# Patient Record
Sex: Female | Born: 1950 | Race: White | Hispanic: No | Smoking: Former smoker
Health system: Southern US, Community
[De-identification: ages and names within clinical notes are randomized; demographics above are authoritative.]

## PROBLEM LIST (undated history)

## (undated) DIAGNOSIS — R7301 Impaired fasting glucose: Secondary | ICD-10-CM

## (undated) DIAGNOSIS — I10 Essential (primary) hypertension: Secondary | ICD-10-CM

## (undated) DIAGNOSIS — K219 Gastro-esophageal reflux disease without esophagitis: Secondary | ICD-10-CM

## (undated) DIAGNOSIS — C801 Malignant (primary) neoplasm, unspecified: Secondary | ICD-10-CM

## (undated) DIAGNOSIS — E785 Hyperlipidemia, unspecified: Secondary | ICD-10-CM

## (undated) HISTORY — PX: PILONIDAL CYST EXCISION: SHX744

## (undated) HISTORY — PX: OTHER SURGICAL HISTORY: SHX169

## (undated) HISTORY — DX: Impaired fasting glucose: R73.01

## (undated) HISTORY — PX: VAGINAL HYSTERECTOMY: SUR661

## (undated) HISTORY — DX: Hyperlipidemia, unspecified: E78.5

## (undated) HISTORY — DX: Essential (primary) hypertension: I10

## (undated) HISTORY — PX: CYSTOCELE REPAIR: SHX163

## (undated) HISTORY — DX: Malignant (primary) neoplasm, unspecified: C80.1

## (undated) HISTORY — DX: Gastro-esophageal reflux disease without esophagitis: K21.9

---

## 1997-12-21 ENCOUNTER — Ambulatory Visit (HOSPITAL_COMMUNITY): Admission: RE | Admit: 1997-12-21 | Discharge: 1997-12-21 | Payer: Self-pay | Admitting: *Deleted

## 1997-12-23 ENCOUNTER — Ambulatory Visit (HOSPITAL_COMMUNITY): Admission: RE | Admit: 1997-12-23 | Discharge: 1997-12-23 | Payer: Self-pay | Admitting: *Deleted

## 1999-11-23 ENCOUNTER — Encounter: Payer: Self-pay | Admitting: Family Medicine

## 1999-11-23 ENCOUNTER — Ambulatory Visit (HOSPITAL_COMMUNITY): Admission: RE | Admit: 1999-11-23 | Discharge: 1999-11-23 | Payer: Self-pay | Admitting: Family Medicine

## 1999-12-01 ENCOUNTER — Encounter: Admission: RE | Admit: 1999-12-01 | Discharge: 1999-12-01 | Payer: Self-pay | Admitting: Family Medicine

## 1999-12-01 ENCOUNTER — Encounter: Payer: Self-pay | Admitting: Family Medicine

## 1999-12-28 ENCOUNTER — Other Ambulatory Visit: Admission: RE | Admit: 1999-12-28 | Discharge: 1999-12-28 | Payer: Self-pay | Admitting: Family Medicine

## 2000-12-06 ENCOUNTER — Encounter: Admission: RE | Admit: 2000-12-06 | Discharge: 2000-12-06 | Payer: Self-pay | Admitting: Family Medicine

## 2000-12-06 ENCOUNTER — Encounter: Payer: Self-pay | Admitting: Family Medicine

## 2001-05-20 ENCOUNTER — Encounter: Admission: RE | Admit: 2001-05-20 | Discharge: 2001-05-20 | Payer: Self-pay | Admitting: Family Medicine

## 2001-05-20 ENCOUNTER — Encounter: Payer: Self-pay | Admitting: Family Medicine

## 2001-12-12 ENCOUNTER — Emergency Department (HOSPITAL_COMMUNITY): Admission: EM | Admit: 2001-12-12 | Discharge: 2001-12-12 | Payer: Self-pay | Admitting: *Deleted

## 2001-12-12 ENCOUNTER — Encounter: Payer: Self-pay | Admitting: Emergency Medicine

## 2002-01-21 ENCOUNTER — Encounter: Payer: Self-pay | Admitting: Family Medicine

## 2002-01-21 ENCOUNTER — Encounter: Admission: RE | Admit: 2002-01-21 | Discharge: 2002-01-21 | Payer: Self-pay | Admitting: Family Medicine

## 2003-01-28 ENCOUNTER — Encounter: Payer: Self-pay | Admitting: Family Medicine

## 2003-01-28 ENCOUNTER — Encounter: Admission: RE | Admit: 2003-01-28 | Discharge: 2003-01-28 | Payer: Self-pay | Admitting: Family Medicine

## 2003-10-21 ENCOUNTER — Other Ambulatory Visit: Admission: RE | Admit: 2003-10-21 | Discharge: 2003-10-21 | Payer: Self-pay | Admitting: Family Medicine

## 2004-02-05 ENCOUNTER — Encounter: Admission: RE | Admit: 2004-02-05 | Discharge: 2004-02-05 | Payer: Self-pay | Admitting: Family Medicine

## 2004-09-13 ENCOUNTER — Ambulatory Visit: Payer: Self-pay | Admitting: Family Medicine

## 2004-09-19 ENCOUNTER — Ambulatory Visit: Payer: Self-pay | Admitting: Family Medicine

## 2005-07-03 ENCOUNTER — Ambulatory Visit: Payer: Self-pay | Admitting: Family Medicine

## 2005-12-04 ENCOUNTER — Encounter: Admission: RE | Admit: 2005-12-04 | Discharge: 2005-12-04 | Payer: Self-pay | Admitting: Family Medicine

## 2005-12-21 ENCOUNTER — Ambulatory Visit: Payer: Self-pay | Admitting: Family Medicine

## 2005-12-26 ENCOUNTER — Encounter: Admission: RE | Admit: 2005-12-26 | Discharge: 2005-12-26 | Payer: Self-pay | Admitting: Family Medicine

## 2006-12-19 ENCOUNTER — Encounter: Payer: Self-pay | Admitting: Family Medicine

## 2006-12-19 ENCOUNTER — Encounter: Payer: Self-pay | Admitting: Internal Medicine

## 2006-12-19 ENCOUNTER — Ambulatory Visit: Payer: Self-pay | Admitting: Family Medicine

## 2006-12-19 LAB — CONVERTED CEMR LAB
ALT: 49 units/L — ABNORMAL HIGH (ref 0–40)
AST: 36 units/L (ref 0–37)
Alkaline Phosphatase: 61 units/L (ref 39–117)
CO2: 31 meq/L (ref 19–32)
Calcium: 9.4 mg/dL (ref 8.4–10.5)
Chloride: 106 meq/L (ref 96–112)
GFR calc Af Amer: 95 mL/min
GFR calc non Af Amer: 79 mL/min
Potassium: 4.1 meq/L (ref 3.5–5.1)
Total Bilirubin: 0.9 mg/dL (ref 0.3–1.2)
Total Protein: 6.9 g/dL (ref 6.0–8.3)

## 2007-01-04 DIAGNOSIS — E785 Hyperlipidemia, unspecified: Secondary | ICD-10-CM

## 2007-01-04 DIAGNOSIS — K589 Irritable bowel syndrome without diarrhea: Secondary | ICD-10-CM

## 2007-01-04 DIAGNOSIS — L719 Rosacea, unspecified: Secondary | ICD-10-CM

## 2007-01-04 DIAGNOSIS — I10 Essential (primary) hypertension: Secondary | ICD-10-CM

## 2007-01-15 ENCOUNTER — Encounter (INDEPENDENT_AMBULATORY_CARE_PROVIDER_SITE_OTHER): Payer: Self-pay | Admitting: *Deleted

## 2007-03-04 ENCOUNTER — Ambulatory Visit: Payer: Self-pay | Admitting: Family Medicine

## 2007-03-04 DIAGNOSIS — K219 Gastro-esophageal reflux disease without esophagitis: Secondary | ICD-10-CM | POA: Insufficient documentation

## 2007-03-04 DIAGNOSIS — M810 Age-related osteoporosis without current pathological fracture: Secondary | ICD-10-CM | POA: Insufficient documentation

## 2007-03-04 DIAGNOSIS — IMO0002 Reserved for concepts with insufficient information to code with codable children: Secondary | ICD-10-CM

## 2007-03-19 ENCOUNTER — Encounter: Payer: Self-pay | Admitting: Family Medicine

## 2007-03-20 ENCOUNTER — Telehealth: Payer: Self-pay | Admitting: Family Medicine

## 2007-08-15 ENCOUNTER — Telehealth: Payer: Self-pay | Admitting: Family Medicine

## 2008-01-08 ENCOUNTER — Telehealth: Payer: Self-pay | Admitting: Family Medicine

## 2008-01-09 ENCOUNTER — Telehealth: Payer: Self-pay | Admitting: Family Medicine

## 2008-04-08 ENCOUNTER — Ambulatory Visit: Payer: Self-pay | Admitting: Family Medicine

## 2008-04-08 DIAGNOSIS — M130 Polyarthritis, unspecified: Secondary | ICD-10-CM

## 2008-04-08 DIAGNOSIS — M1990 Unspecified osteoarthritis, unspecified site: Secondary | ICD-10-CM | POA: Insufficient documentation

## 2008-04-09 ENCOUNTER — Ambulatory Visit: Payer: Self-pay | Admitting: Family Medicine

## 2008-04-09 ENCOUNTER — Encounter: Admission: RE | Admit: 2008-04-09 | Discharge: 2008-04-09 | Payer: Self-pay | Admitting: Family Medicine

## 2008-04-09 LAB — CONVERTED CEMR LAB: Anti Nuclear Antibody(ANA): NEGATIVE

## 2008-04-10 ENCOUNTER — Encounter: Payer: Self-pay | Admitting: Family Medicine

## 2008-04-13 ENCOUNTER — Encounter (INDEPENDENT_AMBULATORY_CARE_PROVIDER_SITE_OTHER): Payer: Self-pay | Admitting: *Deleted

## 2008-04-15 LAB — CONVERTED CEMR LAB
AST: 28 units/L (ref 0–37)
Albumin: 4.4 g/dL (ref 3.5–5.2)
CO2: 30 meq/L (ref 19–32)
Calcium: 9.5 mg/dL (ref 8.4–10.5)
Chloride: 105 meq/L (ref 96–112)
GFR calc Af Amer: 83 mL/min
GFR calc non Af Amer: 69 mL/min
LDL Cholesterol: 95 mg/dL (ref 0–99)
Potassium: 4.3 meq/L (ref 3.5–5.1)
TSH: 2.06 microintl units/mL (ref 0.35–5.50)

## 2008-04-16 ENCOUNTER — Encounter (INDEPENDENT_AMBULATORY_CARE_PROVIDER_SITE_OTHER): Payer: Self-pay | Admitting: *Deleted

## 2008-04-20 ENCOUNTER — Telehealth: Payer: Self-pay | Admitting: Family Medicine

## 2008-05-11 ENCOUNTER — Ambulatory Visit: Payer: Self-pay | Admitting: Family Medicine

## 2008-05-15 ENCOUNTER — Telehealth: Payer: Self-pay | Admitting: Family Medicine

## 2008-05-22 ENCOUNTER — Ambulatory Visit: Payer: Self-pay | Admitting: Family Medicine

## 2008-05-27 ENCOUNTER — Encounter: Payer: Self-pay | Admitting: Family Medicine

## 2008-06-01 ENCOUNTER — Telehealth: Payer: Self-pay | Admitting: Family Medicine

## 2008-08-24 ENCOUNTER — Ambulatory Visit: Payer: Self-pay | Admitting: Family Medicine

## 2008-08-25 LAB — CONVERTED CEMR LAB
ALT: 29 units/L (ref 0–35)
Cholesterol: 165 mg/dL (ref 0–200)
HDL: 47.3 mg/dL (ref >39.0)
VLDL: 21 mg/dL (ref 0–40)

## 2008-09-15 ENCOUNTER — Telehealth: Payer: Self-pay | Admitting: Family Medicine

## 2008-09-28 ENCOUNTER — Encounter (INDEPENDENT_AMBULATORY_CARE_PROVIDER_SITE_OTHER): Payer: Self-pay | Admitting: *Deleted

## 2008-10-02 ENCOUNTER — Telehealth: Payer: Self-pay | Admitting: Family Medicine

## 2008-11-10 ENCOUNTER — Ambulatory Visit: Payer: Self-pay | Admitting: Family Medicine

## 2008-11-26 ENCOUNTER — Telehealth: Payer: Self-pay | Admitting: Family Medicine

## 2008-11-30 ENCOUNTER — Ambulatory Visit: Payer: Self-pay | Admitting: Family Medicine

## 2008-12-02 ENCOUNTER — Encounter: Payer: Self-pay | Admitting: Family Medicine

## 2008-12-05 ENCOUNTER — Encounter: Payer: Self-pay | Admitting: Family Medicine

## 2008-12-05 ENCOUNTER — Ambulatory Visit: Payer: Self-pay | Admitting: Family Medicine

## 2008-12-08 ENCOUNTER — Telehealth: Payer: Self-pay | Admitting: Family Medicine

## 2008-12-16 ENCOUNTER — Telehealth: Payer: Self-pay | Admitting: Family Medicine

## 2008-12-29 ENCOUNTER — Telehealth: Payer: Self-pay | Admitting: Family Medicine

## 2009-01-11 ENCOUNTER — Telehealth: Payer: Self-pay | Admitting: Family Medicine

## 2009-02-11 ENCOUNTER — Telehealth: Payer: Self-pay | Admitting: Family Medicine

## 2009-02-11 DIAGNOSIS — N816 Rectocele: Secondary | ICD-10-CM

## 2009-04-12 ENCOUNTER — Encounter: Admission: RE | Admit: 2009-04-12 | Discharge: 2009-04-12 | Payer: Self-pay | Admitting: Family Medicine

## 2009-04-13 ENCOUNTER — Encounter (INDEPENDENT_AMBULATORY_CARE_PROVIDER_SITE_OTHER): Payer: Self-pay | Admitting: *Deleted

## 2009-04-14 ENCOUNTER — Telehealth: Payer: Self-pay | Admitting: Family Medicine

## 2009-10-11 ENCOUNTER — Telehealth: Payer: Self-pay | Admitting: Family Medicine

## 2009-10-13 ENCOUNTER — Ambulatory Visit: Payer: Self-pay | Admitting: Family Medicine

## 2009-10-14 ENCOUNTER — Encounter: Payer: Self-pay | Admitting: Family Medicine

## 2009-10-15 LAB — CONVERTED CEMR LAB
ALT: 29 units/L (ref 0–35)
AST: 20 units/L (ref 0–37)
Albumin: 4.8 g/dL (ref 3.5–5.2)
Alkaline Phosphatase: 54 units/L (ref 39–117)
CO2: 25 meq/L (ref 19–32)
Cholesterol: 187 mg/dL (ref 0–200)
Creatinine, Ser: 1 mg/dL (ref 0.40–1.20)
Sodium: 143 meq/L (ref 135–145)
TSH: 2.782 microintl units/mL (ref 0.350–4.500)
Total Bilirubin: 0.7 mg/dL (ref 0.3–1.2)
Total CHOL/HDL Ratio: 3.6

## 2009-11-02 ENCOUNTER — Ambulatory Visit: Payer: Self-pay | Admitting: Family Medicine

## 2009-11-02 DIAGNOSIS — J309 Allergic rhinitis, unspecified: Secondary | ICD-10-CM | POA: Insufficient documentation

## 2009-11-02 LAB — CONVERTED CEMR LAB: Rapid Strep: NEGATIVE

## 2009-11-15 ENCOUNTER — Ambulatory Visit: Payer: Self-pay | Admitting: Family Medicine

## 2009-11-15 DIAGNOSIS — E559 Vitamin D deficiency, unspecified: Secondary | ICD-10-CM

## 2009-11-15 DIAGNOSIS — E348 Other specified endocrine disorders: Secondary | ICD-10-CM | POA: Insufficient documentation

## 2009-11-15 LAB — CONVERTED CEMR LAB: Blood Glucose, Fasting: 109 mg/dL

## 2009-11-29 ENCOUNTER — Telehealth: Payer: Self-pay | Admitting: Family Medicine

## 2010-01-14 ENCOUNTER — Encounter: Payer: Self-pay | Admitting: Family Medicine

## 2010-01-14 LAB — CONVERTED CEMR LAB
Cholesterol: 150 mg/dL (ref 0–200)
HDL: 49 mg/dL (ref >39)
LDL Cholesterol: 79 mg/dL (ref 0–99)

## 2010-01-17 ENCOUNTER — Encounter (INDEPENDENT_AMBULATORY_CARE_PROVIDER_SITE_OTHER): Payer: Self-pay | Admitting: *Deleted

## 2010-01-21 ENCOUNTER — Telehealth: Payer: Self-pay | Admitting: Family Medicine

## 2010-03-17 ENCOUNTER — Ambulatory Visit: Payer: Self-pay | Admitting: Family Medicine

## 2010-03-17 DIAGNOSIS — B977 Papillomavirus as the cause of diseases classified elsewhere: Secondary | ICD-10-CM

## 2010-03-17 DIAGNOSIS — F339 Major depressive disorder, recurrent, unspecified: Secondary | ICD-10-CM

## 2010-03-17 LAB — CONVERTED CEMR LAB: Blood Glucose, AC Bkfst: 106 mg/dL

## 2010-03-18 ENCOUNTER — Encounter: Payer: Self-pay | Admitting: Family Medicine

## 2010-03-18 LAB — CONVERTED CEMR LAB
Clue Cells Wet Prep HPF POC: NONE SEEN
Trich, Wet Prep: NONE SEEN
Yeast Wet Prep HPF POC: NONE SEEN

## 2010-04-13 ENCOUNTER — Encounter: Admission: RE | Admit: 2010-04-13 | Discharge: 2010-04-13 | Payer: Self-pay | Admitting: Family Medicine

## 2010-08-20 ENCOUNTER — Encounter: Payer: Self-pay | Admitting: Family Medicine

## 2010-08-21 ENCOUNTER — Encounter: Payer: Self-pay | Admitting: Family Medicine

## 2010-09-01 NOTE — Assessment & Plan Note (Signed)
Summary: HTN, lipids, mood, vaginitis.   Vital Signs:  Patient profile:   61 year old female Menstrual status:  hysterectomy Height:      67.4 inches Weight:      161 pounds Pulse rate:   93 / minute BP sitting:   129 / 81  (left arm) Cuff size:   regular  Vitals Entered By: Avon Gully CMA, Duncan Dull) (March 17, 2010 9:40 AM) CC: f/u BP and check glucose, pt is fasting,also wants refill of ambien   Primary Care Provider:  Nani Gasser MD  CC:  f/u BP and check glucose, pt is fasting, and also wants refill of ambien.  History of Present Illness: f/u BP and check glucose, pt is fasting,also wants refill of ambien. Says it is really helping her sleep.  Needs refills. Has lost 14 lbs. Has been trying to loose weight to bring her sugars down and her BP down.   Her husband passed away last week form esophageal cancer. Hospice has been in involved for the lat 4 months. She is overall doing well. She has very supportive family that lives nearby.  She is taking her fluoxetine. Ambien is helping with her sleep right now.   Current Medications (verified): 1)  Simvastatin 40 Mg Tabs (Simvastatin) .... Take 1 Tablet By Mouth Once A Day 2)  Zyrtec Allergy 10 Mg  Tabs (Cetirizine Hcl) .... Take 1 Tablet By Mouth Once A Day 3)  Cloderm 0.1 %  Crea (Clocortolone Pivalate) .... As Needed Hemorrrhoids 4)  Hydrocortisone Valerate 0.2 %  Crea (Hydrocortisone Valerate) .... Apply To Affected Area Two Times A Day 5)  Vitamin C 500 Mg  Tabs (Ascorbic Acid) .... Take 1 Tablet By Mouth Once A Day 6)  Meloxicam 15 Mg Tabs (Meloxicam) .Marland Kitchen.. 1 Tab By Mouth Daily 7)  Calcium 600/vitamin D 600-400 Mg-Unit Tabs (Calcium Carbonate-Vitamin D) .Marland Kitchen.. 1 Tab By Mouth Two Times A Day 8)  Alendronate Sodium 70 Mg Tabs (Alendronate Sodium) .Marland Kitchen.. 1 Tab By Mouth Each Week 9)  Fluoxetine Hcl 20 Mg Caps (Fluoxetine Hcl) .... Takje One Tablet By Mouth Oncea D Ay 10)  Alodox Convenience 20 Mg Kit (Doxycycline-Eyelid  Cleansers) 11)  Zantac 150 Mg Tabs (Ranitidine Hcl) .... Take One Tablet By Mouth Daily For Heartburn 12)  Fluticasone Propionate 50 Mcg/act Susp (Fluticasone Propionate) .... 2 Sprays in Each Nostril Daily 13)  Vitamin D 2000 Unit Caps (Cholecalciferol) .... Take 1 Tablet By Mouth Once A Day 14)  Zolpidem Tartrate 5 Mg Tabs (Zolpidem Tartrate) .Marland Kitchen.. 1 Tab By Mouth At Bedtime As Needed Sleep  Allergies (verified): 1)  ! Benadryl 2)  ! Codeine 3)  Fish Oil (Omega-3 Fatty Acids)  Comments:  Nurse/Medical Assistant: The patient's medications and allergies were reviewed with the patient and were updated in the Medication and Allergy Lists. Avon Gully CMA, Duncan Dull) (March 17, 2010 9:40 AM)  Physical Exam  General:  Well-developed,well-nourished,in no acute distress; alert,appropriate and cooperative throughout examination Lungs:  Normal respiratory effort, chest expands symmetrically. Lungs are clear to auscultation, no crackles or wheezes. Heart:  Normal rate and regular rhythm. S1 and S2 normal without gallop, murmur, click, rub or other extra sounds. No carotid bruits.  Skin:  no rashes.   Psych:  Cognition and judgment appear intact. Alert and cooperative with normal attention span and concentration. No apparent delusions, illusions, hallucinations   Impression & Recommendations:  Problem # 1:  HYPERTENSION (ICD-401.9) Looks great today.  Continue to monitor.  BP today: 129/81  Prior BP: 129/79 (11/15/2009)  Labs Reviewed: K+: 4.6 (10/14/2009) Creat: : 1.00 (10/14/2009)   Chol: 150 (01/14/2010)   HDL: 49 (01/14/2010)   LDL: 79 (01/14/2010)   TG: 111 (01/14/2010)  Problem # 2:  INSULIN RESISTANCE SYNDROME (ICD-259.8) Glucose is much better. Recheck in 6 months. Good job with the weight loss.  Orders: Fingerstick (36416) Glucose, (CBG) ([address]109)  Problem # 3:  DEPRESSION (ICD-311) Clealry she is greiving right now. Discussed optionof increasing fluoxetine to 40mg  daily.  F/U  in 6 months ot sooner if any problems.  Her updated medication list for this problem includes:    Fluoxetine Hcl 20 Mg Caps (Fluoxetine hcl) .Marland Kitchen... Take two  tablet by mouth once a d ay  Problem # 4:  VAGINITIS (ICD-616.10) She has noticed some irritation. No ithcing or discharge.  She has had a hysterectomy. 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 Will check HIV, RPR, GC/chlam and did get a wet prep as well.  Her updated medication list for this problem includes:    Alodox Convenience 20 Mg Kit (Doxycycline-eyelid cleansers)  Orders: T-Wet Prep for Christoper Allegra, [facility] [id number])  Complete Medication List: 1)  Simvastatin 40 Mg Tabs (Simvastatin) .... Take 1 tablet by mouth once a day 2)  Zyrtec Allergy 10 Mg Tabs (Cetirizine hcl) .... Take 1 tablet by mouth once a day 3)  Cloderm 0.1 % Crea (Clocortolone pivalate) .... As needed hemorrrhoids 4)  Hydrocortisone Valerate 0.2 % Crea (Hydrocortisone valerate) .... Apply to affected area two times a day 5)  Vitamin C 500 Mg Tabs (Ascorbic acid) .... Take 1 tablet by mouth once a day 6)  Meloxicam 15 Mg Tabs (Meloxicam) .Marland Kitchen.. 1 tab by mouth daily 7)  Calcium 600/vitamin D 600-400 Mg-unit Tabs (Calcium carbonate-vitamin d) .Marland Kitchen.. 1 tab by mouth two times a day 8)  Alendronate Sodium 70 Mg Tabs (Alendronate sodium) .Marland Kitchen.. 1 tab by mouth each week 9)  Fluoxetine Hcl 20 Mg Caps (Fluoxetine hcl) .... Take two  tablet by mouth once a d ay 10)  Alodox Convenience 20 Mg Kit (Doxycycline-eyelid cleansers) 11)  Zantac 150 Mg Tabs (Ranitidine hcl) .... Take one tablet by mouth daily for heartburn 12)  Fluticasone Propionate 50 Mcg/act Susp (Fluticasone propionate) .... 2 sprays in each nostril daily 13)  Vitamin D 2000 Unit Caps (Cholecalciferol) .... Take 1 tablet by mouth once a day 14)  Zolpidem Tartrate 5 Mg Tabs (Zolpidem tartrate) .Marland Kitchen.. 1 tab by mouth at bedtime as needed sleep  Other Orders: T-HIV  Antibody  (Reflex) [id number]) T-RPR (Syphilis) [id number]) T-Chlamydia & GC Probe, Urine ([id number])  Patient Instructions: 1)  Please schedule a follow-up appointment in 6 months for glucose .  Prescriptions: FLUOXETINE HCL 20 MG CAPS (FLUOXETINE HCL) Take two  tablet by mouth once a d ay  #180 x 1   Entered and Authorized by:   Nani Gasser MD   Signed by:   Nani Gasser MD on 03/17/2010   Method used:   [facility] to        [facility] [phone][facility]ail[facility]       [address] [place], [place]  [address]       Ph: [phone] or [phone]       Fax: [phone]   RxID:   [id number] ZOLPIDEM TARTRATE 5 MG TABS (ZOLPIDEM TARTRATE) 1 tab by mouth at bedtime as needed  sleep  #30 x 0   Entered and Authorized by:   Nani Gasser MD   Signed by:   Nani Gasser MD on 03/17/2010   Method used:   Printed then faxed to ...       [facility]  [place] [phone][facility] (retail)       [address] [place], [place]  [address]       Ph: [phone] or [phone]       Fax: [phone]   RxID:   [id number]   Laboratory Results   Blood Tests   Date/Time Received: 03/17/10 Date/Time Reported: 03/17/10  CBG Fasting:: 106mg /dL

## 2010-09-01 NOTE — Progress Notes (Signed)
Summary: Eye drops  Phone Note Call from Patient Call back at Home Phone [phone]   Caller: Patient Call For: Nani Gasser MD Summary of Call: pt calls needing her alodox eye drops- I tried to send them but there is no directions Initial call taken by: Kathlene November,  Nov 29, 2009 10:20 AM  Follow-up for Phone Call        Have pharm send [place] refill request.  Follow-up by: Nani Gasser MD,  Nov 29, 2009 10:36 AM  Additional Follow-up for Phone Call Additional follow up Details #1::        Pt notified to have pharmacy send request Additional Follow-up by: Kathlene November,  Nov 29, 2009 10:53 AM

## 2010-09-01 NOTE — Assessment & Plan Note (Signed)
Summary: NOV:CPE   Vital Signs:  Patient profile:   60 year old female Menstrual status:  hysterectomy Height:      67.4 inches Weight:      176 pounds BMI:     27.34 Pulse rate:   78 / minute BP sitting:   131 / 80  (left arm) Cuff size:   regular  Vitals Entered By: Kathlene November (October 13, 2009 9:38 AM) CC: Np- needs CPE no pap Is Patient Diabetic? No     Menstrual Status hysterectomy   CC:  Np- needs CPE no pap.  History of Present Illness: Husband is dying from terminal esophageal cancer.  Has 3 mo to live. He is being traeted at [facility] and she feels she has great support throught he center.  She has attended a few support groups. 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  She says that even through this she has tried to take care of herself and eat well and has actually lost some weight. She is hopeful her choelserol will look better form last check. She was previsously followed by Dr. Ermalene Searing but moved here to be closer to her children.  Dneis any vaginl bleeding or pain.   Habits & Providers  Alcohol-Tobacco-Diet     Alcohol drinks/day: 0     Tobacco Status: quit     Year Quit: 15  Exercise-Depression-Behavior     Does Patient Exercise: yes     Type of exercise: walking     STD Risk: never     Drug Use: never     Seat Belt Use: always  Current Medications (verified): 1)  Simvastatin 40 Mg Tabs (Simvastatin) .... Take 1 Tablet By Mouth Once A Day 2)  Zyrtec Allergy 10 Mg  Tabs (Cetirizine Hcl) .... Take 1 Tablet By Mouth Once A Day 3)  Cloderm 0.1 %  Crea (Clocortolone Pivalate) .... As Needed Hemorrrhoids 4)  Hydrocortisone Valerate 0.2 %  Crea (Hydrocortisone Valerate) .... Apply To Affected Area Two Times A Day 5)  Calcium/magnesium/zinc Formula 1000-500-50 Mg Tabs (Calcium-Magnesium-Zinc) .... Take 1 Tablet By Mouth Once A Day 6)  Vitamin B Complex-C   Caps (B Complex-C) .... Take 1 Tablet By Mouth Once A Day 7)  Vitamin C 500 Mg  Tabs  (Ascorbic Acid) .... Take 1 Tablet By Mouth Once A Day 8)  Meloxicam 15 Mg Tabs (Meloxicam) .Marland Kitchen.. 1 Tab By Mouth Daily 9)  Calcium 600/vitamin D 600-400 Mg-Unit Tabs (Calcium Carbonate-Vitamin D) .Marland Kitchen.. 1 Tab By Mouth Two Times A Day 10)  Alendronate Sodium 70 Mg Tabs (Alendronate Sodium) .Marland Kitchen.. 1 Tab By Mouth Each Week 11)  Fluoxetine Hcl 20 Mg Caps (Fluoxetine Hcl) .... Takje One Tablet By Mouth Oncea D Ay 12)  Alodox Convenience 20 Mg Kit (Doxycycline-Eyelid Cleansers) 13)  Zantac 150 Mg Tabs (Ranitidine Hcl) .... Take One Tablet By Mouth Daily For Heartburn  Allergies (verified): 1)  ! Benadryl 2)  ! Codeine  Comments:  Nurse/Medical Assistant: The patient's medications and allergies were reviewed with the patient and were updated in the Medication and Allergy Lists. Kathlene November (October 13, 2009 9:43 AM)  Past History:  Past Medical History: Last updated: 03/04/2007 Hyperlipidemia Hypertension Glucose impaired GERD  Past Surgical History: Last updated: 05/22/2008 cystocele repair 1993 hysterectomy, vaginal, total 1993, ? cervical dysplasia in past, cervix removed bladder tack 2006 pilonidal cyst 1974  Family History: Last updated: 10/13/2009 Family History of Asthma Family History Depression Family History stomach  cancer Fam Hx Bipolar Fam Hx Bells Palsy Fam Hx [place] mother died age 1 Alzheimers, osteoporosis, parkinsons, bells belsy father died age 64 stomach cancer, diabetes no MI, age 71 brother: diabetes and sister diabetes  Family History: Reviewed history from 03/04/2007 and no changes required. 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  Drug use-no Regular exercise-no, at work Diet: "graze during the day" no fruit, lots of veggies Does Patient Exercise:  yes STD Risk:  never Drug Use:  never Seat Belt Use:  always  Review of Systems       No fever/sweats/weakness, unexplained weight loss/gain.  No vison changes.  No difficulty hearing/ringing in ears, hay fever/allergies.  No chest pain/discomfort, palpitations.  No Br lump/nipple discharge.  No cough/wheeze.  No blood in BM, nausea/vomiting/diarrhea.  No nighttime urination, leaking urine, unusual vaginal bleeding, discharge (penis or vagina).  No muscle/joint pain. No rash, change in mole.  No HA, memory loss.  No anxiety, sleep d/o, + depression.  No easy bruising/bleeding, unexplained lump   Physical Exam  General:  Well-developed,well-nourished,in no acute distress; alert,appropriate and cooperative throughout examination Head:  Normocephalic and atraumatic without obvious abnormalities. No apparent alopecia or balding. Eyes:  No corneal or conjunctival inflammation noted. EOMI. Perrla.  Ears:  External ear exam shows no significant lesions or deformities.  Otoscopic examination reveals clear canals, tympanic membranes are intact bilaterally without bulging, retraction, inflammation or discharge. Hearing is grossly normal bilaterally. Nose:  External nasal examination shows no deformity or inflammation. Mouth:  Oral mucosa and oropharynx without lesions or exudates.  Teeth in good repair. Neck:  No deformities, masses, or tenderness noted. Chest Wall:  No deformities, masses, or tenderness noted. Breasts:  No mass, nodules, thickening, tenderness, bulging, retraction, inflamation, nipple discharge or skin changes noted.   Lungs:  Normal respiratory effort, chest expands symmetrically. Lungs are clear to auscultation, no crackles or wheezes. Heart:  Normal rate and regular rhythm.  S1 and S2 normal without gallop, murmur, click, rub or other extra sounds. Abdomen:  Bowel sounds positive,abdomen soft and non-tender without masses, organomegaly or hernias noted. Msk:  No deformity or scoliosis noted of thoracic or lumbar spine.   Pulses:  R and L carotid,radial,,dorsalis pedis and posterior tibial pulses are full and equal bilaterally Extremities:  No clubbing, cyanosis, edema, or deformity noted with normal full range of motion of all joints.   Neurologic:  No cranial nerve deficits noted. Station and gait are normal.  DTRs are symmetrical throughout. Sensory, motor and coordinative functions appear intact. Skin:  no rashes.   Cervical Nodes:  No lymphadenopathy noted Axillary Nodes:  No palpable lymphadenopathy Psych:  Cognition and judgment appear intact. Alert and cooperative with normal attention span and concentration. No apparent delusions, illusions, hallucinations   Impression & Recommendations:  Problem # 1:  WELL WOMAN (ICD-V70.0) Overall health appears good. She is consistant with her meds and dose need refills today. Will check her labs.  She is hopeful to be able to reduce her statin since has lost weight.   She seems to have a good support system in place for her emotional health.  BP at goal.  Due for DEXA in september.   Will check to see when due for next colonoscopy and let her know.  Orders: T-Comprehensive Metabolic Panel [id number]) T-Lipid Profile [id number]) T-TSH [id number]) T-Vitamin D (25-Hydroxy) [id number])  Complete Medication List: 1)  Simvastatin 40 Mg Tabs (Simvastatin) .... Take 1 tablet by mouth once a day 2)  Zyrtec Allergy 10 Mg Tabs (Cetirizine hcl) .... Take 1 tablet by mouth once a day 3)  Cloderm 0.1 % Crea (Clocortolone pivalate) .... As needed hemorrrhoids 4)  Hydrocortisone Valerate 0.2 % Crea (Hydrocortisone valerate) .... Apply to affected area two times a day 5)  Calcium/magnesium/zinc Formula 1000-500-50 Mg  Tabs (Calcium-magnesium-zinc) .... Take 1 tablet by mouth once a day 6)  Vitamin B Complex-c Caps (B complex-c) .... Take 1 tablet by mouth once a day 7)  Vitamin C 500 Mg Tabs (Ascorbic acid) .... Take 1 tablet by mouth once a day 8)  Meloxicam 15 Mg Tabs (Meloxicam) .Marland Kitchen.. 1 tab by mouth daily 9)  Calcium 600/vitamin D 600-400 Mg-unit Tabs (Calcium carbonate-vitamin d) .Marland Kitchen.. 1 tab by mouth two times a day 10)  Alendronate Sodium 70 Mg Tabs (Alendronate sodium) .Marland Kitchen.. 1 tab by mouth each week 11)  Fluoxetine Hcl 20 Mg Caps (Fluoxetine hcl) .... Takje one tablet by mouth oncea d ay 12)  Alodox Convenience 20 Mg Kit (Doxycycline-eyelid cleansers) 13)  Zantac 150 Mg Tabs (Ranitidine hcl) .... Take one tablet by mouth daily for heartburn  Patient Instructions: 1)  Try to go for labs fasting for 8 hours.  We will call you with the results.  2)  Continue with regular exercise and healthy diet. Prescriptions: FLUOXETINE HCL 20 MG CAPS (FLUOXETINE HCL) Takje one tablet by mouth oncea d ay  #90 x 0   Entered and Authorized by:   Nani Gasser MD   Signed by:   Nani Gasser MD on 10/13/2009   Method used:   [facility]ically to        [facility] [phone][facility] (retail)       [address] [place], [place]  [address]       Ph: [phone] or [phone]       Fax: [phone]   RxID:   [id number] ALENDRONATE SODIUM 70 MG TABS (ALENDRONATE SODIUM) 1 tab by mouth each week  #12 x 3   Entered and Authorized by:   Nani Gasser MD   Signed by:   Nani Gasser MD on 10/13/2009   Method used:   Electronically to        [facility] [phone]* (retail)       [address] [place], [place]  [address]       Ph: [phone] or [phone]       Fax: [phone]   RxID:   [id number] MELOXICAM 15 MG TABS (MELOXICAM) 1 tab by mouth daily  #90 x 3   Entered and Authorized by:   Nani Gasser MD   Signed by:   Nani Gasser MD on 10/13/2009   Method used:    Electronically to        [facility] [phone]* (retail)       [address] [place], [place]  [address]       Ph: [phone] or [phone]       Fax: [phone]   RxID:   [id number] HYDROCORTISONE VALERATE 0.2 %  CREA (HYDROCORTISONE VALERATE) Apply to affected area two times a day  #1 x 1   Entered and Authorized by:   Nani Gasser MD   Signed by:   Nani Gasser MD on 10/13/2009   Method used:   Electronically to        [facility] [phone]* (retail)       [address] [place], [place]  [address]       Ph: [phone] or [phone]       Fax: [phone]   RxID:   [id number] CLODERM 0.1 %  CREA (CLOCORTOLONE PIVALATE) as needed hemorrrhoids  #1 tube x 1   Entered and Authorized by:   Nani Gasser MD   Signed by:   Nani Gasser MD on 10/13/2009   Method used:   Electronically to        [facility] [phone]* (retail)       [address] [place], [place]  [address]       Ph: [phone] or [phone]       Fax: [phone]   RxID:   [id number] SIMVASTATIN 40 MG TABS (SIMVASTATIN) Take 1 tablet by mouth once a day  #30 Tablet x 6   Entered and Authorized by:   Nani Gasser MD   Signed by:   Nani Gasser MD on 10/13/2009   Method used:   Electronically to        [facility] [phone]* (retail)       [address] [place], [place]  [address]       Ph: [phone] or [phone]       Fax: [phone]   RxID:   [id number]   Last Flu Vaccine:  Fluvax 3+ (05/11/2008 10:34:05 AM) Flu Vaccine Result Date:  04/30/2009 Flu Vaccine Result:  given Flu Vaccine Next Due:  1 yr    Prevention & Chronic Care Immunizations   Influenza vaccine: given  (04/30/2009)   Influenza vaccine due: 04/30/2010    Tetanus booster: 07/31/2000: Historical   Tetanus booster due: 07/31/2010    Pneumococcal vaccine: Not documented  Colorectal Screening   Hemoccult: Not documented   Hemoccult due: Not  Indicated    Colonoscopy: hemmorhoids  (07/31/1998)   Colonoscopy due: 07/31/2008  Other Screening   Pap smear: Not documented   Pap smear due: Not Indicated    Mammogram: ASSESSMENT: Negative - BI-RADS 1^MM DIGITAL SCREENING  (04/12/2009)   Mammogram due: 04/12/2010   Smoking status: quit  (10/13/2009)  Lipids   Total Cholesterol: 165  (08/24/2008)   LDL: 97  (08/24/2008)   LDL Direct: Not documented   HDL: 47.3  (08/24/2008)   Triglycerides: 105  (08/24/2008)    SGOT (AST): 22  (08/24/2008)   SGPT (ALT): 29  (08/24/2008) CMP ordered    Alkaline phosphatase: 50  (04/09/2008)   Total bilirubin: 1.0  (04/09/2008)  Hypertension   Last Blood Pressure: 131 / 80  (10/13/2009)   Serum creatinine: 0.9  (04/09/2008)   Serum potassium 4.3  (04/09/2008) CMP ordered   Self-Management Support :    Hypertension self-management support: Not documented    Lipid self-management support: Not documented     Appended Document: NOV:CPE  Last Colonoscopy:  hemmorhoids (07/31/1998 2:33:39 PM) Colonoscopy Result Date:  12/01/2003 Colonoscopy Result:  normal

## 2010-09-01 NOTE — Progress Notes (Signed)
Summary: not sleeping  Phone Note Call from Patient Call back at [phone] or [phone]   Caller: Patient Call For: Yanis Larin Summary of Call: Pt husband stage 4 esophageal cancer and she is his only care taker- not sleeping well at all and has used Ambien in the past and worked well and wonders if can get a rx for this. [facility]ses [facility]. Initial call taken by: Kathlene November,  January 21, 2010 8:42 AM  Follow-up for Phone Call        Ambien filled for as needed use at bedtime.  Schedule f/u OV in 2-3 wks.   Follow-up by: Seymour Bars DO,  January 21, 2010 8:45 AM    New/Updated Medications: ZOLPIDEM TARTRATE 5 MG TABS (ZOLPIDEM TARTRATE) 1 tab by mouth at bedtime as needed sleep Prescriptions: ZOLPIDEM TARTRATE 5 MG TABS (ZOLPIDEM TARTRATE) 1 tab by mouth at bedtime as needed sleep  #20 x 0   Entered and Authorized by:   Seymour Bars DO   Signed by:   Seymour Bars DO on 01/21/2010   Method used:   Printed then faxed to ...       [facility]  [place] [phone]* (retail)       [address] [place], [place]  [address]       Ph: [phone] or [phone]       Fax: [phone]   RxID:   [id number]   Appended Document: not sleeping Pt notified med sent and MD instructions. kJ LPN

## 2010-09-01 NOTE — Assessment & Plan Note (Signed)
Summary: URI, allergies   Vital Signs:  Patient profile:   60 year old female Menstrual status:  hysterectomy Height:      67.4 inches Weight:      176 pounds Temp:     97.9 degrees F oral Pulse rate:   80 / minute BP sitting:   129 / 88  (left arm) Cuff size:   regular  Vitals Entered By: Kathlene November (November 02, 2009 11:16 AM) CC: scratchy raw throat, eyes watering, nasal drainage, some cough. Started Sunday night   CC:  scratchy raw throat, eyes watering, nasal drainage, and some cough. Started Sunday night.  History of Present Illness: scratchy raw throat, eyes watering, nasal drainage, some mild cough. Started Sunday night. Now painful to swallow. Hx of allergies.  Does take zyrtec nightly. No nasal congestion.  No fever.  Tried some theraflu last night - made her feel worse.  No GI s.E.   Current Medications (verified): 1)  Simvastatin 40 Mg Tabs (Simvastatin) .... Take 1 Tablet By Mouth Once A Day 2)  Zyrtec Allergy 10 Mg  Tabs (Cetirizine Hcl) .... Take 1 Tablet By Mouth Once A Day 3)  Cloderm 0.1 %  Crea (Clocortolone Pivalate) .... As Needed Hemorrrhoids 4)  Hydrocortisone Valerate 0.2 %  Crea (Hydrocortisone Valerate) .... Apply To Affected Area Two Times A Day 5)  Calcium/magnesium/zinc Formula 1000-500-50 Mg Tabs (Calcium-Magnesium-Zinc) .... Take 1 Tablet By Mouth Once A Day 6)  Vitamin B Complex-C   Caps (B Complex-C) .... Take 1 Tablet By Mouth Once A Day 7)  Vitamin C 500 Mg  Tabs (Ascorbic Acid) .... Take 1 Tablet By Mouth Once A Day 8)  Meloxicam 15 Mg Tabs (Meloxicam) .Marland Kitchen.. 1 Tab By Mouth Daily 9)  Calcium 600/vitamin D 600-400 Mg-Unit Tabs (Calcium Carbonate-Vitamin D) .Marland Kitchen.. 1 Tab By Mouth Two Times A Day 10)  Alendronate Sodium 70 Mg Tabs (Alendronate Sodium) .Marland Kitchen.. 1 Tab By Mouth Each Week 11)  Fluoxetine Hcl 20 Mg Caps (Fluoxetine Hcl) .... Takje One Tablet By Mouth Oncea D Ay 12)  Alodox Convenience 20 Mg Kit (Doxycycline-Eyelid Cleansers) 13)  Zantac 150 Mg  Tabs (Ranitidine Hcl) .... Take One Tablet By Mouth Daily For Heartburn  Allergies (verified): 1)  ! Benadryl 2)  ! Codeine  Comments:  Nurse/Medical Assistant: The patient's medications and allergies were reviewed with the patient and were updated in the Medication and Allergy Lists. Kathlene November (November 02, 2009 11:18 AM)  Physical Exam  General:  Well-developed,well-nourished,in no acute distress; alert,appropriate and cooperative throughout examination Head:  Normocephalic and atraumatic without obvious abnormalities. No apparent alopecia or balding. Eyes:  No corneal or conjunctival inflammation noted. EOMI. Perrla. Ears:  External ear exam shows no significant lesions or deformities.  Otoscopic examination reveals clear canals, tympanic membranes are intact bilaterally without bulging, retraction, inflammation or discharge. Hearing is grossly normal bilaterally. Nose:  External nasal examination shows no deformity or inflammation. Nasal mucosa are pink and moist without lesions or exudates. Mouth:  Oral mucosa and oropharynx without lesions or exudates.  Teeth in good repair. Neck:  No deformities, masses, or tenderness noted. NO Tm.  Lungs:  Normal respiratory effort, chest expands symmetrically. Lungs are clear to auscultation, no crackles or wheezes. Heart:  Normal rate and regular rhythm. S1 and S2 normal without gallop, murmur, click, rub or other extra sounds. Skin:  no rashes.   Cervical Nodes:  No lymphadenopathy noted Psych:  Cognition and judgment appear intact. Alert and cooperative  with normal attention span and concentration. No apparent delusions, illusions, hallucinations   Impression & Recommendations:  Problem # 1:  URI (ICD-465.9)  Her updated medication list for this problem includes:    Zyrtec Allergy 10 Mg Tabs (Cetirizine hcl) .Marland Kitchen... Take 1 tablet by mouth once a day    Meloxicam 15 Mg Tabs (Meloxicam) .Marland Kitchen... 1 tab by mouth daily  Instructed on symptomatic  treatment. Call if symptoms persist or worsen.   Orders: Rapid Strep ([address]109)  Problem # 2:  ALLERGIC RHINITIS (ICD-477.9) Conitnue her zyrtec nightly. If ST resovles but drianage and watery eyes are not better then let me know and can add a nasal steroid for better relief.  Her updated medication list for this problem includes:    Zyrtec Allergy 10 Mg Tabs (Cetirizine hcl) .Marland Kitchen... Take 1 tablet by mouth once a day  Complete Medication List: 1)  Simvastatin 40 Mg Tabs (Simvastatin) .... Take 1 tablet by mouth once a day 2)  Zyrtec Allergy 10 Mg Tabs (Cetirizine hcl) .... Take 1 tablet by mouth once a day 3)  Cloderm 0.1 % Crea (Clocortolone pivalate) .... As needed hemorrrhoids 4)  Hydrocortisone Valerate 0.2 % Crea (Hydrocortisone valerate) .... Apply to affected area two times a day 5)  Calcium/magnesium/zinc Formula 1000-500-50 Mg Tabs (Calcium-magnesium-zinc) .... Take 1 tablet by mouth once a day 6)  Vitamin B Complex-c Caps (B complex-c) .... Take 1 tablet by mouth once a day 7)  Vitamin C 500 Mg Tabs (Ascorbic acid) .... Take 1 tablet by mouth once a day 8)  Meloxicam 15 Mg Tabs (Meloxicam) .Marland Kitchen.. 1 tab by mouth daily 9)  Calcium 600/vitamin D 600-400 Mg-unit Tabs (Calcium carbonate-vitamin d) .Marland Kitchen.. 1 tab by mouth two times a day 10)  Alendronate Sodium 70 Mg Tabs (Alendronate sodium) .Marland Kitchen.. 1 tab by mouth each week 11)  Fluoxetine Hcl 20 Mg Caps (Fluoxetine hcl) .... Takje one tablet by mouth oncea d ay 12)  Alodox Convenience 20 Mg Kit (Doxycycline-eyelid cleansers) 13)  Zantac 150 Mg Tabs (Ranitidine hcl) .... Take one tablet by mouth daily for heartburn  Laboratory Results  Date/Time Received: 11/02/2009 Date/Time Reported: 11/02/2009  Other Tests  Rapid Strep: negative

## 2010-09-01 NOTE — Assessment & Plan Note (Signed)
Summary: 1 MONTH FU BS, BP   Vital Signs:  Patient profile:   60 year old female Menstrual status:  hysterectomy Height:      67.4 inches Weight:      175 pounds Pulse rate:   82 / minute BP sitting:   129 / 79  (left arm) Cuff size:   regular  Vitals Entered By: Kathlene November (November 15, 2009 8:32 AM) CC: followup BS and BP.    Primary Care Provider:  Nani Gasser MD  CC:  followup BS and BP. Marland Kitchen  History of Present Illness: followup BS and BP. Here to review labs and discuss. Has tried fish oil in the past but caused vomiting.    Husband only has weeks to live.   Current Medications (verified): 1)  Simvastatin 40 Mg Tabs (Simvastatin) .... Take 1 Tablet By Mouth Once A Day 2)  Zyrtec Allergy 10 Mg  Tabs (Cetirizine Hcl) .... Take 1 Tablet By Mouth Once A Day 3)  Cloderm 0.1 %  Crea (Clocortolone Pivalate) .... As Needed Hemorrrhoids 4)  Hydrocortisone Valerate 0.2 %  Crea (Hydrocortisone Valerate) .... Apply To Affected Area Two Times A Day 5)  Vitamin C 500 Mg  Tabs (Ascorbic Acid) .... Take 1 Tablet By Mouth Once A Day 6)  Meloxicam 15 Mg Tabs (Meloxicam) .Marland Kitchen.. 1 Tab By Mouth Daily 7)  Calcium 600/vitamin D 600-400 Mg-Unit Tabs (Calcium Carbonate-Vitamin D) .Marland Kitchen.. 1 Tab By Mouth Two Times A Day 8)  Alendronate Sodium 70 Mg Tabs (Alendronate Sodium) .Marland Kitchen.. 1 Tab By Mouth Each Week 9)  Fluoxetine Hcl 20 Mg Caps (Fluoxetine Hcl) .... Takje One Tablet By Mouth Oncea D Ay 10)  Alodox Convenience 20 Mg Kit (Doxycycline-Eyelid Cleansers) 11)  Zantac 150 Mg Tabs (Ranitidine Hcl) .... Take One Tablet By Mouth Daily For Heartburn  Allergies (verified): 1)  ! Benadryl 2)  ! Codeine 3)  Fish Oil (Omega-3 Fatty Acids)  Comments:  Nurse/Medical Assistant: The patient's medications and allergies were reviewed with the patient and were updated in the Medication and Allergy Lists. Kathlene November (November 15, 2009 8:33 AM)  Physical Exam  General:  Well-developed,well-nourished,in no  acute distress; alert,appropriate and cooperative throughout examination Lungs:  Normal respiratory effort, chest expands symmetrically. Lungs are clear to auscultation, no crackles or wheezes. Heart:  Normal rate and regular rhythm. S1 and S2 normal without gallop, murmur, click, rub or other extra sounds.   Impression & Recommendations:  Problem # 1:  HYPERTENSION (ICD-401.9) Looks great today !!' BP today: 129/79 Prior BP: 129/88 (11/02/2009)  Labs Reviewed: K+: 4.6 (10/14/2009) Creat: : 1.00 (10/14/2009)   Chol: 187 (10/14/2009)   HDL: 52 (10/14/2009)   LDL: 95 (10/14/2009)   TG: 199 (10/14/2009)  Problem # 2:  HYPERLIPIDEMIA (ICD-272.4) Doing well overall. Discussed starting flax see instaed of Fish oil since had vomiting with  Fish oil.  Will recheck in 2 months.  Her updated medication list for this problem includes:    Simvastatin 40 Mg Tabs (Simvastatin) .Marland Kitchen... Take 1 tablet by mouth once a day  Orders: T-Triglycerides ([id number])  Problem # 3:  INSULIN RESISTANCE SYNDROME (ICD-259.8) Assessment: New Imporoved but still in the insulin resistance range.  Discusse what this means. REviewed dietary changes to decrease sugar and carb intake overall. Recheck in 4 months.  Orders: Fingerstick (09811) Capillary Blood Glucose/CBG (91478)  Problem # 4:  VITAMIN D DEFICIENCY (ICD-268.9) Recheck in 2 months. She is on 2000iu daily.  Orders: T-Vitamin D (25-Hydroxy) [phone])  Complete Medication List: 1)  Simvastatin 40 Mg Tabs (Simvastatin) .... Take 1 tablet by mouth once a day 2)  Zyrtec Allergy 10 Mg Tabs (Cetirizine hcl) .... Take 1 tablet by mouth once a day 3)  Cloderm 0.1 % Crea (Clocortolone pivalate) .... As needed hemorrrhoids 4)  Hydrocortisone Valerate 0.2 % Crea (Hydrocortisone valerate) .... Apply to affected area two times a day 5)  Vitamin C 500 Mg Tabs (Ascorbic acid) .... Take 1 tablet by mouth once a day 6)  Meloxicam 15 Mg Tabs (Meloxicam) .Marland Kitchen.. 1 tab  by mouth daily 7)  Calcium 600/vitamin D 600-400 Mg-unit Tabs (Calcium carbonate-vitamin d) .Marland Kitchen.. 1 tab by mouth two times a day 8)  Alendronate Sodium 70 Mg Tabs (Alendronate sodium) .Marland Kitchen.. 1 tab by mouth each week 9)  Fluoxetine Hcl 20 Mg Caps (Fluoxetine hcl) .... Takje one tablet by mouth oncea d ay 10)  Alodox Convenience 20 Mg Kit (Doxycycline-eyelid cleansers) 11)  Zantac 150 Mg Tabs (Ranitidine hcl) .... Take one tablet by mouth daily for heartburn 12)  Fluticasone Propionate 50 Mcg/act Susp (Fluticasone propionate) .... 2 sprays in each nostril daily 13)  Vitamin D 2000 Unit Caps (Cholecalciferol) .... Take 1 tablet by mouth once a day  Patient Instructions: 1)  Go for labs in 2 months. 2)  Please schedule a follow-up appointment in 4 months for sugars.  Prescriptions: FLUTICASONE PROPIONATE 50 MCG/ACT SUSP (FLUTICASONE PROPIONATE) 2 sprays in each nostril daily  #1 bottle x 1   Entered and Authorized by:   Nani Gasser MD   Signed by:   Nani Gasser MD on 11/15/2009   Method used:   [facility]        [facility] [phone][facility] (retail)       [address] [place], [place]  [address]       Ph: [phone] or [phone]       Fax: [phone]   RxID:   [id number]   Laboratory Results   Blood Tests   Date/Time Received: 11/15/2009 Date/Time Reported: 11/15/2009  Glucose (fasting): 109 mg/dL   (Normal Range: 36-644)

## 2010-09-01 NOTE — Progress Notes (Signed)
Summary: refill  Phone Note Refill Request Message from:  [employer] on October 11, 2009 7:54 AM  Refills Requested: Medication #1:  MELOXICAM 15 MG TABS 1 tab by mouth daily   Supply Requested: 1 month [phone]   Method Requested: Electronic Initial call taken by: Benny Lennert CMA Duncan Dull),  October 11, 2009 7:55 AM    Prescriptions: MELOXICAM 15 MG TABS (MELOXICAM) 1 tab by mouth daily  #30 Tablet x 4   Entered and Authorized by:   Kerby Nora MD   Signed by:   Kerby Nora MD on 10/11/2009   Method used:   [facility] (retail)       [address]       [place], [place]  [address]       Ph: [phone] or [phone]       Fax: [phone]   RxID:   [id number]   Appended Document: refill Notify pt medication refilled, but she is overdue for CPE...please schedule prior to further refills. Fasting lipids, CMET prior Dx v77.91  Appended Document: refill Correction diagnosis 272.0  Appended Document: refill Patient doesnt work at work number anymore and also home number is wrong.Consuello Masse CMA will try and find another number

## 2010-09-01 NOTE — Letter (Signed)
Summary: Generic Letter  [facility]  [address]   [place], [place] [address]   Phone: [phone]  Fax: [phone]    01/17/2010  MARYCLARE NYDAM [address] [place], [place]  [address]  Dear Ms. Lame,   We have received your lab results back. Your triglycerides are much better this time. Good work! Lets recheck in 1 year. Make sure that you have a follow-up for blood pressure check in November.        Sincerely,   Nani Gasser, MD

## 2010-09-16 ENCOUNTER — Telehealth: Payer: Self-pay | Admitting: Family Medicine

## 2010-09-21 NOTE — Progress Notes (Signed)
Summary: [facility]-refill ambien  Phone Note Call from Patient Call back at [phone]   Caller: Patient Reason for Call: Refill Medication Summary of Call: Pt requesting refill on generic ambien.  Pt states she called pharmacy earlier in week.  Pt is going out of town and would like to have this filled today.  Thanks Initial call taken by: Francee Piccolo CMA Duncan Dull),  September 16, 2010 4:00 PM  Follow-up for Phone Call        left message on pts vm that rx has been called in Follow-up by: Avon Gully CMA, Duncan Dull),  September 16, 2010 4:34 PM

## 2010-10-04 ENCOUNTER — Telehealth: Payer: Self-pay | Admitting: Family Medicine

## 2010-10-05 ENCOUNTER — Encounter: Payer: Self-pay | Admitting: Family Medicine

## 2010-10-05 ENCOUNTER — Ambulatory Visit: Payer: Self-pay | Admitting: Family Medicine

## 2010-10-05 DIAGNOSIS — I10 Essential (primary) hypertension: Secondary | ICD-10-CM | POA: Insufficient documentation

## 2010-10-05 DIAGNOSIS — E348 Other specified endocrine disorders: Secondary | ICD-10-CM

## 2010-10-05 DIAGNOSIS — F329 Major depressive disorder, single episode, unspecified: Secondary | ICD-10-CM

## 2010-10-05 DIAGNOSIS — Z2911 Encounter for prophylactic immunotherapy for respiratory syncytial virus (RSV): Secondary | ICD-10-CM

## 2010-10-05 DIAGNOSIS — Z23 Encounter for immunization: Secondary | ICD-10-CM

## 2010-10-05 LAB — CONVERTED CEMR LAB: Hgb A1c MFr Bld: 5.4 %

## 2010-10-11 NOTE — Assessment & Plan Note (Signed)
Summary: HTN, depression. etc   Vital Signs:  Patient profile:   60 year old female Menstrual status:  hysterectomy Height:      67.4 inches Weight:      174 pounds Pulse rate:   75 / minute BP sitting:   139 / 93  (right arm) Cuff size:   regular  Vitals Entered By: Avon Gully CMA, Duncan Dull) (October 05, 2010 8:48 AM) CC: f/u meds   Primary Care Provider:  Nani Gasser MD  CC:  f/u meds.  History of Present Illness: Her to f/u insulin resistance.    BP is elevated today.  NO CP or SOB.  Has gained 13  At night only taking 1/2 tab of ambien now.  She is still taking her prozac. Mood is overall good. Her husband passed away in 04-01-23.   Allergies: 1)  ! Benadryl 2)  ! 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  Drug use-no Regular exercise-no, at work Diet: "graze during the day" no fruit, lots of veggies  Social History: Reviewed history from 10/13/2009 and no changes required. 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  Drug use-no Regular exercise-no, at work Diet: "graze during the day" no fruit, lots of veggies  Physical Exam  General:  Well-developed,well-nourished,in no acute distress; alert,appropriate and cooperative throughout examination Neck:  No deformities, masses, or tenderness noted. NO TM.  Lungs:  Normal respiratory effort, chest expands symmetrically. Lungs are clear to auscultation, no crackles or wheezes. Heart:  Normal rate and regular rhythm. S1 and S2 normal without gallop, murmur, click, rub or other extra sounds. Skin:  no rashes.   Psych:  Cognition and judgment appear intact. Alert and cooperative with normal attention span and concentration. No apparent delusions, illusions,  hallucinations   Impression & Recommendations:  Problem # 1:  HYPERTENSION, BENIGN (ICD-401.1) Assessment New REecheck in 1 month. Work on exercise and diet.  i think if she can lose the weight we can stop the BP med.  Her updated medication list for this problem includes:    Hydrochlorothiazide 12.5 Mg Caps (Hydrochlorothiazide) .Marland Kitchen... Take 1 tablet by mouth once a day  BP today: 139/93 Prior BP: 129/81 (03/17/2010)  Labs Reviewed: K+: 4.6 (10/14/2009) Creat: : 1.00 (10/14/2009)   Chol: 150 (01/14/2010)   HDL: 49 (01/14/2010)   LDL: 79 (01/14/2010)   TG: 111 (01/14/2010)  Problem # 2:  DEPRESSION (ICD-311) She is very happy with her current regimen. Continue her meds and f/u in 3 months.    Her updated medication list for this problem includes:    Fluoxetine Hcl 20 Mg Caps (Fluoxetine hcl) .Marland Kitchen... Take two  tablet by mouth once a d ay  Problem # 3:  INSULIN RESISTANCE SYNDROME (ICD-259.8) A1C is at goal. Continue to monitor diet. Follow sugars twice a year.  Orders: Fingerstick (36416) Hemoglobin A1C (83036)  Complete Medication List: 1)  Simvastatin 40 Mg Tabs (Simvastatin) .... Take 1 tablet by mouth once a day 2)  Zyrtec Allergy 10 Mg Tabs (Cetirizine hcl) .... Take 1 tablet by mouth once a day 3)  Cloderm 0.1 % Crea (Clocortolone pivalate) .... As needed hemorrrhoids 4)  Hydrocortisone Valerate 0.2 % Crea (Hydrocortisone valerate) .... Apply to affected area two times a day 5)  Vitamin C 500  Mg Tabs (Ascorbic acid) .... Take 1 tablet by mouth once a day 6)  Meloxicam 15 Mg Tabs (Meloxicam) .Marland Kitchen.. 1 tab by mouth daily 7)  Calcium 600/vitamin D 600-400 Mg-unit Tabs (Calcium carbonate-vitamin d) .Marland Kitchen.. 1 tab by mouth two times a day 8)  Alendronate Sodium 70 Mg Tabs (Alendronate sodium) .Marland Kitchen.. 1 tab by mouth each week 9)  Fluoxetine Hcl 20 Mg Caps (Fluoxetine hcl) .... Take two  tablet by mouth once a d ay 10)  Alodox Convenience 20 Mg Kit (Doxycycline-eyelid cleansers) 11)  Zantac  150 Mg Tabs (Ranitidine hcl) .... Take one tablet by mouth daily for heartburn 12)  Fluticasone Propionate 50 Mcg/act Susp (Fluticasone propionate) .... 2 sprays in each nostril daily 13)  Vitamin D 2000 Unit Caps (Cholecalciferol) .... Take 1 tablet by mouth once a day 14)  Zolpidem Tartrate 5 Mg Tabs (Zolpidem tartrate) .Marland Kitchen.. 1 tab by mouth at bedtime as needed sleep 15)  Hydrochlorothiazide 12.5 Mg Caps (Hydrochlorothiazide) .... Take 1 tablet by mouth once a day  Other Orders: T-Dual DXA Bone Density/ Axial (16109) Tdap => 11yrs IM (60454) Admin 1st Vaccine (09811) Zoster (Shingles) Vaccine Live (91478) Admin of Any Addtl Vaccine (29562)  Patient Instructions: 1)  Please schedule a follow-up appointment in 1 month for blood pressure. 2)  Work on exercise and healty diet 3)  You were given Tdap and Shingles vaccines today  4)  Will be due for fasting labs in June for cholesterol and complete metabolic panel.    Contraindications/Deferment of Procedures/Staging:    Test/Procedure: FLU VAX    Reason for deferment: patient declined  Prescriptions: HYDROCHLOROTHIAZIDE 12.5 MG CAPS (HYDROCHLOROTHIAZIDE) Take 1 tablet by mouth once a day  #30 x 0   Entered and Authorized by:   Nani Gasser MD   Signed by:   Nani Gasser MD on 10/05/2010   Method used:   [facility]        [facility] [phone]* (retail)       [address] [place], [place]  [address]       Ph: [phone] or [phone]       Fax: [phone]   RxID:   [id number]    Orders Added: 1)  Est. Patient Level IV [51884] 2)  T-Dual DXA Bone Density/ Axial [77080] 3)  Tdap => 83yrs IM [90715] 4)  Admin 1st Vaccine [90471] 5)  Zoster (Shingles) Vaccine Live [90736] 6)  Admin of Any Addtl Vaccine [90472] 7)  Fingerstick [36416] 8)  Hemoglobin A1C [83036] 9)  Est. Patient Level IV [16606]   Immunizations Administered:  Tetanus Vaccine:    Vaccine Type: Tdap    Site: left deltoid    Mfr:  GlaxoSmithKline    Dose: 0.5 ml    Route: IM    Given by: Sue Lush McCrimmon CMA, (AAMA)    Exp. Date: 05/19/2012    Lot #: [id number]    VIS given: 06/17/08 version given October 05, 2010.  Zostavax # 0:    Vaccine Type: Zostavax    Site: right deltoid    Mfr: Merck    Dose: 0.5 ml    Route: IM    Given by: Sue Lush McCrimmon CMA, (AAMA)    Exp. Date: 03/18/2011    Lot #: [id number]    VIS given: 05/12/05 given October 05, 2010.   Immunizations Administered:  Tetanus Vaccine:    Vaccine Type: Tdap    Site: left deltoid  Mfr: GlaxoSmithKline    Dose: 0.5 ml    Route: IM    Given by: Sue Lush McCrimmon CMA, (AAMA)    Exp. Date: 05/19/2012    Lot #: [id number]    VIS given: 06/17/08 version given October 05, 2010.  Zostavax # 0:    Vaccine Type: Zostavax    Site: right deltoid    Mfr: Merck    Dose: 0.5 ml    Route: IM    Given by: Sue Lush McCrimmon CMA, (AAMA)    Exp. Date: 03/18/2011    Lot #: [id number]    VIS given: 05/12/05 given October 05, 2010.   Laboratory Results   Blood Tests   Date/Time Received: 10/05/10 Date/Time Reported: 10/05/10  HGBA1C: 5.4%   (Normal Range: Non-Diabetic - 3-6%   Control Diabetic - 6-8%)

## 2010-10-11 NOTE — Progress Notes (Signed)
Summary: Refill Meloxicam  Phone Note Refill Request Message from:  Fax from Pharmacy on October 04, 2010 9:26 AM  Refills Requested: Medication #1:  MELOXICAM 15 MG TABS 1 tab by mouth daily   Dosage confirmed as above?Dosage Confirmed   Last Refilled: 11/29/2009 pt is due for diabetes, depression follow up.   Method Requested: Electronic Next Appointment Scheduled: None Initial call taken by: Francee Piccolo CMA Duncan Dull),  October 04, 2010 9:27 AM  Follow-up for Phone Call        Denies. Pt needs F/U appt.  Follow-up by: Nani Gasser MD,  October 04, 2010 10:05 AM  Additional Follow-up for Phone Call Additional follow up Details #1::        Denied rx at pharm. Additional Follow-up by: Francee Piccolo CMA Duncan Dull),  October 04, 2010 10:57 AM

## 2010-11-03 ENCOUNTER — Other Ambulatory Visit: Payer: Self-pay | Admitting: Family Medicine

## 2010-11-07 ENCOUNTER — Other Ambulatory Visit: Payer: Self-pay | Admitting: Family Medicine

## 2010-11-07 MED ORDER — MELOXICAM 15 MG PO TABS: 15.0000 mg | ORAL_TABLET | Freq: Every day | ORAL | Status: DC

## 2010-11-08 ENCOUNTER — Encounter: Payer: Self-pay | Admitting: Family Medicine

## 2010-11-08 ENCOUNTER — Ambulatory Visit (INDEPENDENT_AMBULATORY_CARE_PROVIDER_SITE_OTHER): Payer: Self-pay | Admitting: Family Medicine

## 2010-11-08 DIAGNOSIS — M810 Age-related osteoporosis without current pathological fracture: Secondary | ICD-10-CM

## 2010-11-08 DIAGNOSIS — E785 Hyperlipidemia, unspecified: Secondary | ICD-10-CM

## 2010-11-08 DIAGNOSIS — I10 Essential (primary) hypertension: Secondary | ICD-10-CM

## 2010-11-08 DIAGNOSIS — Z0000 Encounter for general adult medical examination without abnormal findings: Secondary | ICD-10-CM

## 2010-11-08 LAB — COMPLETE METABOLIC PANEL WITH GFR
ALT: 31 U/L (ref 0–35)
AST: 20 U/L (ref 0–37)
Alkaline Phosphatase: 47 U/L (ref 39–117)
Creat: 0.95 mg/dL (ref 0.40–1.20)
Sodium: 140 mEq/L (ref 135–145)
Total Bilirubin: 0.6 mg/dL (ref 0.3–1.2)
Total Protein: 6.5 g/dL (ref 6.0–8.3)

## 2010-11-08 LAB — LIPID PANEL
HDL: 56 mg/dL (ref >39)
LDL Cholesterol: 82 mg/dL (ref 0–99)
Total CHOL/HDL Ratio: 2.9 Ratio
Triglycerides: 131 mg/dL (ref <150)
VLDL: 26 mg/dL (ref 0–40)

## 2010-11-08 LAB — CBC
HCT: 42.9 % (ref 36.0–46.0)
MCHC: 34 g/dL (ref 30.0–36.0)
Platelets: 251 10*3/uL (ref 150–400)
RDW: 12.8 % (ref 11.5–15.5)

## 2010-11-08 MED ORDER — ZOLPIDEM TARTRATE 5 MG PO TABS: 5.0000 mg | ORAL_TABLET | Freq: Every evening | ORAL | Status: DC | PRN

## 2010-11-08 MED ORDER — HYDROCHLOROTHIAZIDE 12.5 MG PO CAPS: 12.5000 mg | ORAL_CAPSULE | Freq: Every day | ORAL | Status: DC

## 2010-11-08 MED ORDER — FLUOXETINE HCL 20 MG PO CAPS: 20.0000 mg | ORAL_CAPSULE | Freq: Every day | ORAL | Status: DC

## 2010-11-08 MED ORDER — SIMVASTATIN 40 MG PO TABS: 40.0000 mg | ORAL_TABLET | Freq: Every day | ORAL | Status: DC

## 2010-11-08 MED ORDER — MELOXICAM 15 MG PO TABS: 15.0000 mg | ORAL_TABLET | Freq: Every day | ORAL | Status: DC

## 2010-11-08 MED ORDER — ALENDRONATE SODIUM 70 MG PO TABS: 70.0000 mg | ORAL_TABLET | ORAL | Status: DC

## 2010-11-08 NOTE — Patient Instructions (Signed)
Make sure continuing your calcium.  They should call you to schedule your DEXA Bone Density scan.

## 2010-11-08 NOTE — Progress Notes (Signed)
Subjective:    Patient ID: Margaret [name], female    DOB: [birth year], 60 y.o.   MRN: [id number]  HPI Feel better on the diuretic and her BP is much better.  No more facial flushing. No other concerns.    Review of Systems  Constitutional: Negative for fever and unexpected weight change.  HENT: Negative for congestion, rhinorrhea, sneezing and postnasal drip.   Eyes: Negative for visual disturbance.  Respiratory: Negative for chest tightness and shortness of breath.   Cardiovascular: Negative for chest pain.  Genitourinary: Negative for dysuria.  Skin: Negative for rash.  Neurological: Negative for syncope and headaches.  Hematological: Negative for adenopathy.  Psychiatric/Behavioral: Negative for dysphoric mood.   BP 126/84  Pulse 72  Ht 5\' 7"  (1.702 m)  Wt 174 lb (78.926 kg)  BMI 27.25 kg/m2    Allergies  Allergen Reactions  . Codeine   . Diphenhydramine Hcl   . Fish Oil     REACTION: vomiting.    Past Medical History  Diagnosis Date  . Hyperlipidemia   . Hypertension   . Impaired fasting glucose   . GERD (gastroesophageal reflux disease)     Past Surgical History  Procedure Date  . Cystocele repair   . Vaginal hysterectomy   . Bladder tack   . Pilonidal cyst excision     History   Social History  . Marital Status: Single    Spouse Name: N/A    Number of Children: N/A  . Years of Education: N/A   Occupational History  . Not on file.   Social History Main Topics  . Smoking status: Former Smoker    Types: Cigarettes  . Smokeless tobacco: Not on file  . Alcohol Use: No  . Drug Use: No  . Sexually Active:    Other Topics Concern  . 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 Drug use-noRegular exercise-no, at workDiet: "graze during the day" no fruit, lots of veggies    Family  History  Problem Relation Age of Onset  . Asthma    . Depression    . Stomach cancer    . Bipolar disorder    . Bell's palsy    . Diabetes    . Parkinsonism        Objective:   Physical Exam  Constitutional: She is oriented to person, place, and time. She appears well-developed and well-nourished.  HENT:  Head: Normocephalic and atraumatic.  Right Ear: External ear normal.  Left Ear: External ear normal.  Nose: Nose normal.  Mouth/Throat: Oropharynx is clear and moist.  Eyes: Conjunctivae and EOM are normal. Pupils are equal, round, and reactive to light.  Neck: Normal range of motion. Neck supple. No thyromegaly present.  Cardiovascular: Normal rate, regular rhythm, normal heart sounds and intact distal pulses.        No carotid or abdominal bruits.   Pulmonary/Chest: Effort normal and breath sounds normal.       Normal breast exam bilaterally.   Abdominal: Soft. Bowel sounds are normal.  Musculoskeletal: Normal range of motion.  Lymphadenopathy:    She has no cervical adenopathy.  Neurological: She is alert and oriented to person, place, and time. She has normal reflexes.  Skin: Skin is warm and dry.  Psychiatric: She has a normal mood and affect.  Assessment & Plan:  CPE - doing well. Lab slip given for screening labs.  BP much improved. Refills sent. Mood is well controlled.  Continue calcium for her osteoprosis. Due for DEXA.  Her colonoscopy and mammogram are upto date. Vaccines are up todate. Had Tdap and shingles vaccine at her last visit.  F/u in 6 months.

## 2010-11-08 NOTE — Assessment & Plan Note (Signed)
Much better controlled today. 

## 2010-11-09 ENCOUNTER — Telehealth: Payer: Self-pay | Admitting: Family Medicine

## 2010-11-09 NOTE — Telephone Encounter (Signed)
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  Cholesterol look s great.

## 2010-11-09 NOTE — Telephone Encounter (Signed)
Left message on pt's vm with results

## 2010-11-22 ENCOUNTER — Other Ambulatory Visit: Payer: Self-pay | Admitting: *Deleted

## 2010-11-22 MED ORDER — CLOCORTOLONE PIVALATE 0.1 % EX CREA: TOPICAL_CREAM | CUTANEOUS | Status: DC

## 2011-03-23 ENCOUNTER — Ambulatory Visit (INDEPENDENT_AMBULATORY_CARE_PROVIDER_SITE_OTHER): Payer: Self-pay | Admitting: Family Medicine

## 2011-03-23 VITALS — Temp 98.5°F | Wt 178.0 lb

## 2011-03-23 DIAGNOSIS — Z23 Encounter for immunization: Secondary | ICD-10-CM

## 2011-03-23 NOTE — Progress Notes (Signed)
  Subjective:    Patient ID: Margaret [name], female    DOB: 1950-12-14, 60 y.o.   MRN: [id number]  HPI   Review of Systems     Objective:   Physical Exam        Assessment & Plan:  Pt presented today for a nurse visit for Boostrix immunization; however, pt did not need that vaccine since received last on 10-05-2010.  Pt however was recommended she go ahead and receive the influenza vaccine for this year.  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  Pt was asked if allergies to eggs, chicken etc.  Pt was well today, and no cough or cold symptoms.  Afebrile.  Current VIS sheet given to pt. Jarvis Newcomer, LPN Domingo Dimes

## 2011-04-03 ENCOUNTER — Other Ambulatory Visit: Payer: Self-pay | Admitting: Family Medicine

## 2011-05-14 ENCOUNTER — Other Ambulatory Visit: Payer: Self-pay | Admitting: Family Medicine

## 2011-05-15 ENCOUNTER — Other Ambulatory Visit: Payer: Self-pay | Admitting: *Deleted

## 2011-05-22 ENCOUNTER — Other Ambulatory Visit: Payer: Self-pay | Admitting: Family Medicine

## 2011-06-01 ENCOUNTER — Other Ambulatory Visit: Payer: Self-pay | Admitting: Family Medicine

## 2011-06-01 NOTE — Telephone Encounter (Signed)
Pt is due for 6 mth follow up per provider order from 6 mths ago.

## 2011-06-08 ENCOUNTER — Other Ambulatory Visit: Payer: Self-pay | Admitting: Family Medicine

## 2011-06-09 ENCOUNTER — Other Ambulatory Visit: Payer: Self-pay | Admitting: Family Medicine

## 2011-09-30 ENCOUNTER — Emergency Department
Admission: EM | Admit: 2011-09-30 | Discharge: 2011-09-30 | Disposition: A | Discharge status: Home / Self Care | Payer: Self-pay | Source: Home / Self Care | Attending: Family Medicine | Admitting: Family Medicine

## 2011-09-30 DIAGNOSIS — J069 Acute upper respiratory infection, unspecified: Secondary | ICD-10-CM

## 2011-09-30 DIAGNOSIS — J029 Acute pharyngitis, unspecified: Secondary | ICD-10-CM

## 2011-09-30 LAB — POCT RAPID STREP A (OFFICE): Rapid Strep A Screen: NEGATIVE

## 2011-09-30 MED ORDER — LIDOCAINE VISCOUS 2 % MT SOLN: 15.0000 mL | Freq: Three times a day (TID) | OROMUCOSAL | Status: AC

## 2011-09-30 MED ORDER — BENZONATATE 200 MG PO CAPS: 200.0000 mg | ORAL_CAPSULE | Freq: Every day | ORAL | Status: AC

## 2011-09-30 MED ORDER — AMOXICILLIN 875 MG PO TABS: 875.0000 mg | ORAL_TABLET | Freq: Two times a day (BID) | ORAL | Status: AC

## 2011-09-30 NOTE — ED Provider Notes (Signed)
History     CSN: [id number]  Arrival date & time 09/30/11  1124   First MD Initiated Contact with Patient 09/30/11 1259      Chief Complaint  Patient presents with  . Cough    x 5days  . Sore Throat    x 5days      HPI Comments: Patient complains of approximately 6 day history of gradually progressive URI symptoms beginning with a mild sore throat (now persistent), followed by progressive nasal congestion.  A cough started about 4 days ago, and bilateral earache two days ago.  Complains of fatigue and initial myalgias.  Cough is now worse at night and generally non-productive during the day.  There has been no pleuritic pain, shortness of breath, or wheezes.   The history is provided by the patient.    Past Medical History  Diagnosis Date  . Hyperlipidemia   . Hypertension   . Impaired fasting glucose   . GERD (gastroesophageal reflux disease)     Past Surgical History  Procedure Date  . Cystocele repair   . Vaginal hysterectomy   . Bladder tack   . Pilonidal cyst excision     Family History  Problem Relation Age of Onset  . Asthma    . Depression    . Stomach cancer Father 56    deceased  . Diabetes Father   . Cancer Father     stomach  . Bipolar disorder    . Bell's palsy Mother   . Parkinsonism Mother     died age 17  . Dementia Mother   . Asthma Mother   . Diabetes Sister   . Diabetes Brother     History  Substance Use Topics  . Smoking status: Former Smoker    Types: Cigarettes  . Smokeless tobacco: Not on file  . Alcohol Use: No    OB History    Grav Para Term Preterm Abortions TAB SAB Ect Mult Living                  Review of Systems + sore throat + cough No pleuritic pain No wheezing + nasal congestion + post-nasal drainage ? sinus pain/pressure No itchy/red eyes + earache No hemoptysis No SOB + fever, + chills No nausea No vomiting No abdominal pain No diarrhea + constipation No urinary symptoms No skin rashes +  fatigue + myalgias + headache Used OTC meds without relief  Allergies  Codeine; Diphenhydramine hcl; and Fish oil  Home Medications   Current Outpatient Rx  Name Route Sig Dispense Refill  . VITAMIN C 500 MG PO TABS Oral Take 500 mg by mouth daily.      Marland Kitchen CALCIUM CITRATE-VITAMIN D PO Oral Take by mouth. States she take 1200 mg / 400 mg per tablet    . CETIRIZINE HCL 10 MG PO CHEW Oral Chew 10 mg by mouth daily.      Marland Kitchen CLODERM EX Apply externally Apply topically.      Marland Kitchen CLOCORTOLONE PIVALATE 0.1 % EX CREA  Apply as directed as needed 30 g 1  . FLUOXETINE HCL 20 MG PO CAPS Oral Take 1 capsule (20 mg total) by mouth daily. 90 capsule 1  . FOSAMAX 70 MG PO TABS  TAKE 1 TABLET ONCE A WEEK IN THE MORNING WITH A GLASS OF WATER, 30 MINUTES BEFORE A MEAL OR BEVERAGE. REMAIN UPRIGHT 12 each 2  . HYDROCHLOROTHIAZIDE 12.5 MG PO CAPS  TAKE (1) CAPSULE DAILY. 30 capsule 0  .  HYDROCORTISONE BUTYRATE 0.1 % EX CREA Topical Apply topically 2 (two) times daily.      Marland Kitchen MOBIC 15 MG PO TABS  TAKE 1 TABLET DAILY 90 each 0  . ZOCOR 40 MG PO TABS  TAKE 1 TABLET AT BEDTIME. 90 each 0  . ZOLPIDEM TARTRATE 5 MG PO TABS  TAKE 1 TABLET AT BEDTIME AS NEEDED 90 tablet 0  . AMOXICILLIN 875 MG PO TABS Oral Take 1 tablet (875 mg total) by mouth 2 (two) times daily. 20 tablet 0  . BENZONATATE 200 MG PO CAPS Oral Take 1 capsule (200 mg total) by mouth at bedtime. Take as needed for cough 12 capsule 0  . CALCIUM CARBONATE 600 MG PO TABS Oral Take 600 mg by mouth 2 (two) times daily with a meal.      . LIDOCAINE VISCOUS 2 % MT SOLN Oral Take 15 mLs by mouth 4 (four) times daily -  before meals and at bedtime. Gargle, then expectorate 200 mL 0    BP 138/95  Pulse 107  Temp(Src) 98.3 F (36.8 C) (Oral)  Resp 18  Ht 5\' 7"  (1.702 m)  Wt 160 lb (72.576 kg)  BMI 25.06 kg/m2  SpO2 97%  Physical Exam Nursing notes and Vital Signs reviewed. Appearance:  Patient appears healthy, stated age, and in no acute distress Eyes:   Pupils are equal, round, and reactive to light and accomodation.  Extraocular movement is intact.  Conjunctivae are not inflamed  Ears:  Canals normal.  Tympanic membranes normal.  Nose:  Mildly congested turbinates.  No sinus tenderness.    Pharynx:  Erythematous Neck:  Supple.  Slightly tender shotty anterior/posterior nodes are palpated bilaterally  Lungs:  Clear to auscultation.  Breath sounds are equal.  Heart:  Regular rate and rhythm without murmurs, rubs, or gallops.  Abdomen:  Nontender without masses or hepatosplenomegaly.  Bowel sounds are present.  No CVA or flank tenderness.  Extremities:  No edema.  No calf tenderness Skin:  No rash present.   ED Course  Procedures none   Labs Reviewed  POCT RAPID STREP A (OFFICE) negative  STREP A DNA PROBE pending      1. Acute pharyngitis   2. Acute upper respiratory infections of unspecified site       MDM  ? False negative rapid strep.  Throat culture pending.  Rx given for Lidocaine viscous gargle Begin amoxicillin.  If cough develops at bedtime, begin Tessalon HS Take Mucinex D, or plain Mucinex (guaifenesin) twice daily for cough and congestion.  Increase fluid intake, rest. May use Afrin nasal spray (or generic oxymetazoline) twice daily for about 5 days.  Also recommend using saline nasal spray several times daily and saline nasal irrigation (AYR is a common brand) May begin Benzonatate at bedtime if cough develops Stop all antihistamines for now, and other non-prescription cough/cold preparations. Follow-up with family doctor if not improving 7 to 10 days.        Donna Christen, MD 10/02/11 1630

## 2011-09-30 NOTE — ED Notes (Signed)
Patient complains of dry cough, sore throat, hoarseness, bilateral ear pain, watery eyes and headache x 5 days. She did take Mucinex and ibuprofen with little relief.

## 2011-09-30 NOTE — Discharge Instructions (Signed)
Take Mucinex D, or plain Mucinex (guaifenesin) twice daily for cough and congestion.  Increase fluid intake, rest. May use Afrin nasal spray (or generic oxymetazoline) twice daily for about 5 days.  Also recommend using saline nasal spray several times daily and saline nasal irrigation (AYR is a common brand) May begin Benzonatate at bedtime if cough develops Stop all antihistamines for now, and other non-prescription cough/cold preparations. Follow-up with family doctor if not improving 7 to 10 days.

## 2011-10-01 LAB — STREP A DNA PROBE: GASP: NEGATIVE

## 2011-10-03 ENCOUNTER — Other Ambulatory Visit: Payer: Self-pay | Admitting: Family Medicine

## 2011-11-30 ENCOUNTER — Other Ambulatory Visit: Payer: Self-pay | Admitting: Family Medicine

## 2011-12-01 ENCOUNTER — Other Ambulatory Visit: Payer: Self-pay | Admitting: *Deleted

## 2011-12-01 MED ORDER — MELOXICAM 15 MG PO TABS: 15.0000 mg | ORAL_TABLET | Freq: Every day | ORAL | Status: DC

## 2011-12-01 MED ORDER — ZOLPIDEM TARTRATE 5 MG PO TABS: 5.0000 mg | ORAL_TABLET | Freq: Every evening | ORAL | Status: DC | PRN

## 2011-12-24 ENCOUNTER — Other Ambulatory Visit: Payer: Self-pay | Admitting: Family Medicine

## 2012-02-27 ENCOUNTER — Other Ambulatory Visit: Payer: Self-pay | Admitting: Family Medicine

## 2012-07-18 ENCOUNTER — Encounter: Payer: Self-pay | Admitting: Family Medicine

## 2012-07-18 ENCOUNTER — Ambulatory Visit (INDEPENDENT_AMBULATORY_CARE_PROVIDER_SITE_OTHER): Payer: Self-pay | Admitting: Family Medicine

## 2012-07-18 VITALS — BP 139/85 | HR 81 | Wt 175.0 lb

## 2012-07-18 DIAGNOSIS — I10 Essential (primary) hypertension: Secondary | ICD-10-CM

## 2012-07-18 DIAGNOSIS — L719 Rosacea, unspecified: Secondary | ICD-10-CM

## 2012-07-18 DIAGNOSIS — E785 Hyperlipidemia, unspecified: Secondary | ICD-10-CM

## 2012-07-18 MED ORDER — FLUOXETINE HCL 20 MG PO CAPS: 20.0000 mg | ORAL_CAPSULE | Freq: Every day | ORAL | Status: DC

## 2012-07-18 MED ORDER — HYDROCHLOROTHIAZIDE 12.5 MG PO CAPS: 12.5000 mg | ORAL_CAPSULE | ORAL | Status: DC

## 2012-07-18 MED ORDER — HYDROCORTISONE 2.5 % EX CREA: TOPICAL_CREAM | Freq: Every day | CUTANEOUS | Status: DC | PRN

## 2012-07-18 MED ORDER — DOXYCYCLINE HYCLATE 100 MG PO TBEC: 100.0000 mg | DELAYED_RELEASE_TABLET | Freq: Every day | ORAL | Status: DC

## 2012-07-18 MED ORDER — SIMVASTATIN 40 MG PO TABS: 40.0000 mg | ORAL_TABLET | Freq: Every day | ORAL | Status: DC

## 2012-07-18 MED ORDER — MELOXICAM 15 MG PO TABS: 15.0000 mg | ORAL_TABLET | Freq: Every day | ORAL | Status: DC | PRN

## 2012-07-18 MED ORDER — ZOLPIDEM TARTRATE 5 MG PO TABS: 5.0000 mg | ORAL_TABLET | Freq: Every evening | ORAL | Status: DC | PRN

## 2012-07-18 MED ORDER — CLOCORTOLONE PIVALATE 0.1 % EX CREA: TOPICAL_CREAM | CUTANEOUS | Status: DC

## 2012-07-18 MED ORDER — ALENDRONATE SODIUM 70 MG PO TABS: 70.0000 mg | ORAL_TABLET | ORAL | Status: DC

## 2012-07-18 NOTE — Progress Notes (Signed)
  Subjective:    Patient ID: Margaret [name], female    DOB: June 05, 1951, 61 y.o.   MRN: [id number]  HPI HTN-  Pt denies chest pain, SOB, dizziness, or heart palpitations.  Taking meds as directed w/o problems.  Denies medication side effects.    Hyperlpidemia- has been out of meds for at least 4 months. Want to restart.    Rosacea - Wants to restart her meds including her doxycycline. She estimates is restart her topical medications. She typically uses hydrocortisone 2%. She says she mixes it with water but a very small amount on her face and it works Insurance account manager.  Review of Systems     Objective:   Physical Exam  Constitutional: She is oriented to person, place, and time. She appears well-developed and well-nourished.  HENT:  Head: Normocephalic and atraumatic.  Cardiovascular: Normal rate, regular rhythm and normal heart sounds.   Pulmonary/Chest: Effort normal and breath sounds normal.  Neurological: She is alert and oriented to person, place, and time.  Skin: Skin is warm and dry.  Psychiatric: She has a normal mood and affect. Her behavior is normal.          Assessment & Plan:  HTN - control is borderline. It definitely want her restart her medications I think it will look great. Due for CMP and fasting lipid panel.  Hyperlipidemia - encouraged her to restart her medications that she has insurance and go to the lab in 6-8 weeks to recheck her lipids when she's on the medication.  Rosacea-I. restart her medications including her doxycycline. Did warn that doxycycline is not a long-term use for this. Recommend treat for 1-2 months and then try to come off of the medication. Can be used for flares and to get things under better control. We also discussed make sure that she's hydrating her scan. She wants to minimize her use of the topical corticosteroid. I explained her to not use it for more than 2 Weeks the Rd. She can also mix it with a fragrance free dye free,  moisturizing  lotion and this will help the medication work better.   I strong encouraged her to followup in the spring to get a physical and so that we can address some of her preventative care measures.

## 2012-07-19 ENCOUNTER — Other Ambulatory Visit: Payer: Self-pay | Admitting: *Deleted

## 2012-10-12 ENCOUNTER — Other Ambulatory Visit: Payer: Self-pay | Admitting: Family Medicine

## 2012-12-18 ENCOUNTER — Telehealth: Payer: Self-pay | Admitting: *Deleted

## 2012-12-18 MED ORDER — TRIAMCINOLONE ACETONIDE 0.5 % EX OINT: TOPICAL_OINTMENT | Freq: Two times a day (BID) | CUTANEOUS | Status: DC | PRN

## 2012-12-18 NOTE — Telephone Encounter (Signed)
Pt calls and states that she uses Cloderm 1% for her pyles? And she has no insurance right now and [facility] told her it was going to be 300.00. She wants to know if there is anything else you could send that would be comperable but cheaper.

## 2012-12-18 NOTE — Telephone Encounter (Signed)
Pt notified new med sent to pharmacy. Barry Dienes, LPN

## 2012-12-18 NOTE — Telephone Encounter (Signed)
OK, will send over different steroid.

## 2013-01-15 ENCOUNTER — Ambulatory Visit: Payer: Self-pay | Admitting: Family Medicine

## 2013-01-15 DIAGNOSIS — Z0289 Encounter for other administrative examinations: Secondary | ICD-10-CM

## 2013-01-16 ENCOUNTER — Other Ambulatory Visit: Payer: Self-pay | Admitting: Family Medicine

## 2013-01-27 ENCOUNTER — Other Ambulatory Visit: Payer: Self-pay | Admitting: Family Medicine

## 2013-02-04 ENCOUNTER — Other Ambulatory Visit: Payer: Self-pay | Admitting: Family Medicine

## 2013-02-04 NOTE — Telephone Encounter (Signed)
Is this ok?

## 2013-02-05 ENCOUNTER — Other Ambulatory Visit: Payer: Self-pay | Admitting: Family Medicine

## 2013-02-28 ENCOUNTER — Other Ambulatory Visit: Payer: Self-pay | Admitting: Family Medicine

## 2013-04-15 ENCOUNTER — Other Ambulatory Visit: Payer: Self-pay | Admitting: Family Medicine

## 2013-08-19 ENCOUNTER — Encounter: Payer: Self-pay | Admitting: Family Medicine

## 2013-08-19 ENCOUNTER — Other Ambulatory Visit: Payer: Self-pay | Admitting: Family Medicine

## 2013-08-19 ENCOUNTER — Ambulatory Visit (INDEPENDENT_AMBULATORY_CARE_PROVIDER_SITE_OTHER): Payer: Self-pay | Admitting: Family Medicine

## 2013-08-19 VITALS — BP 144/88 | HR 106 | Temp 98.1°F | Ht 68.0 in | Wt 187.0 lb

## 2013-08-19 DIAGNOSIS — I10 Essential (primary) hypertension: Secondary | ICD-10-CM

## 2013-08-19 DIAGNOSIS — Z0000 Encounter for general adult medical examination without abnormal findings: Secondary | ICD-10-CM

## 2013-08-19 DIAGNOSIS — F329 Major depressive disorder, single episode, unspecified: Secondary | ICD-10-CM

## 2013-08-19 DIAGNOSIS — K649 Unspecified hemorrhoids: Secondary | ICD-10-CM | POA: Insufficient documentation

## 2013-08-19 DIAGNOSIS — E785 Hyperlipidemia, unspecified: Secondary | ICD-10-CM

## 2013-08-19 DIAGNOSIS — L719 Rosacea, unspecified: Secondary | ICD-10-CM

## 2013-08-19 DIAGNOSIS — F3289 Other specified depressive episodes: Secondary | ICD-10-CM

## 2013-08-19 MED ORDER — HYDROCHLOROTHIAZIDE 12.5 MG PO CAPS: ORAL_CAPSULE | ORAL | Status: DC

## 2013-08-19 MED ORDER — TRIAMCINOLONE ACETONIDE 0.5 % EX OINT: TOPICAL_OINTMENT | Freq: Two times a day (BID) | CUTANEOUS | Status: DC | PRN

## 2013-08-19 MED ORDER — FLUOXETINE HCL 10 MG PO CAPS: ORAL_CAPSULE | ORAL | Status: DC

## 2013-08-19 MED ORDER — SIMVASTATIN 40 MG PO TABS: ORAL_TABLET | ORAL | Status: DC

## 2013-08-19 MED ORDER — HYDROCORTISONE 2.5 % EX CREA: TOPICAL_CREAM | Freq: Every day | CUTANEOUS | Status: DC | PRN

## 2013-08-19 NOTE — Patient Instructions (Signed)
Keep up a regular exercise program and make sure you are eating a healthy diet Try to eat 4 servings of dairy a day, or if you are lactose intolerant take a calcium with vitamin D daily.  Your vaccines are up to date.   

## 2013-08-19 NOTE — Progress Notes (Signed)
Subjective:     Margaret [name] is a 63 y.o. female and is here for a comprehensive physical exam. The patient reports problems - She needs refill on topical steroid cream. He says her rosacea has been flaring. Also she would like refill on her creams for her hemorrhoids.  Feels her mod has been more down and would like to restart her prozac.  She is without insurance right now.    She constantly feels hot, has had some dry skin. + fam hx of thyroid hx. she wonders if it could be hormone related. She had a complete history me several years ago and was on hormones for a period of time but has been off for the most 2 years.  Hypertension-she ran out of medications over 6 months ago. She's not currently taking anything. No chest pain or short of breath.  Hyperlipidemia-has been off his statin for quite some time.  History   Social History  . Marital Status: Single    Spouse Name: N/A    Number of Children: N/A  . Years of Education: N/A   Occupational History  . Not on file.   Social History Main Topics  . Smoking status: Former Smoker    Types: Cigarettes  . Smokeless tobacco: Not on file  . Alcohol Use: No  . Drug Use: No  . Sexual Activity:    Other Topics Concern  . Not on file   Social History Narrative   Reviewed history from 10/13/2009 and no changes required.   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    Drug use-no   Regular exercise-no, at work   Diet: "graze during the day" no fruit, lots of veggies   Health Maintenance  Topic Date Due  . Mammogram  04/13/2012  . Influenza Vaccine  02/28/2013  . Colonoscopy  11/30/2013  . Tetanus/tdap  10/04/2020  . Zostavax  Completed    The following portions of the patient's history were reviewed and updated as appropriate: allergies, current medications, past family history, past medical history, past social history, past surgical  history and problem list.  Review of Systems A comprehensive review of systems was negative.   Objective:    BP 144/88  Pulse 106  Temp(Src) 98.1 F (36.7 C)  Ht 5\' 8"  (1.727 m)  Wt 187 lb (84.823 kg)  BMI 28.44 kg/m2  SpO2 98% General appearance: alert, cooperative and appears stated age Head: Normocephalic, without obvious abnormality, atraumatic Eyes: conj clear, EOMi, PEERLA Ears: normal TM's and external ear canals both ears Nose: Nares normal. Septum midline. Mucosa normal. No drainage or sinus tenderness. Throat: lips, mucosa, and tongue normal; teeth and gums normal Neck: no adenopathy, no carotid bruit, no JVD, supple, symmetrical, trachea midline and thyroid not enlarged, symmetric, no tenderness/mass/nodules Back: symmetric, no curvature. ROM normal. No CVA tenderness. Lungs: clear to auscultation bilaterally Breasts: normal appearance, no masses or tenderness Heart: regular rate and rhythm, S1, S2 normal, no murmur, click, rub or gallop Abdomen: soft, non-tender; bowel sounds normal; no masses,  no organomegaly Extremities: extremities normal, atraumatic, no cyanosis or edema Pulses: 2+ and symmetric Skin: Skin color, texture, turgor normal. No rashes or lesions Lymph nodes: Cervical, supraclavicular, and axillary nodes normal. Neurologic: Alert and oriented X 3, normal strength and tone. Normal symmetric reflexes. Normal coordination and gait    Assessment:    Healthy female exam.  Plan:     See After Visit Summary for Counseling Recommendations  Keep up a regular exercise program and make sure you are eating a healthy diet Try to eat 4 servings of dairy a day, or if you are lactose intolerant take a calcium with vitamin D daily.  Your vaccines are up to date.   Hypertension-uncontrolled. Will restart blood pressure medication. Followup in 6 months. Call if any problems or side effects. She is without insurance.  Hyperlipidemia-we'll restart  simvastatin. Says took also then please let me know and we consider changing to pravastatin.  Hemorrhoids-refill steroid cream.  Rosacea-refill topical steroid cream. Make sure to use minimally.  Depression-restart fluoxetine. She's been on off it for several years since her husband passed away.

## 2013-08-20 ENCOUNTER — Other Ambulatory Visit: Payer: Self-pay | Admitting: *Deleted

## 2013-08-20 MED ORDER — ALENDRONATE SODIUM 70 MG PO TABS: ORAL_TABLET | ORAL | Status: DC

## 2013-08-20 NOTE — Telephone Encounter (Signed)
Looks like this was discontinued due to cost.  Did ya'll discuss going back on at last OV on 1/15?

## 2013-08-22 LAB — COMPLETE METABOLIC PANEL WITH GFR
ALT: 51 U/L — ABNORMAL HIGH (ref 0–35)
AST: 30 U/L (ref 0–37)
Albumin: 4.8 g/dL (ref 3.5–5.2)
Alkaline Phosphatase: 68 U/L (ref 39–117)
BILIRUBIN TOTAL: 0.8 mg/dL (ref 0.3–1.2)
BUN: 19 mg/dL (ref 6–23)
CO2: 28 meq/L (ref 19–32)
CREATININE: 0.92 mg/dL (ref 0.50–1.10)
Calcium: 9.6 mg/dL (ref 8.4–10.5)
Chloride: 103 mEq/L (ref 96–112)
GFR, EST AFRICAN AMERICAN: 77 mL/min
GFR, EST NON AFRICAN AMERICAN: 66 mL/min
GLUCOSE: 106 mg/dL — AB (ref 70–99)
Potassium: 4.7 mEq/L (ref 3.5–5.3)
Sodium: 139 mEq/L (ref 135–145)
Total Protein: 7.6 g/dL (ref 6.0–8.3)

## 2013-08-22 LAB — LIPID PANEL
Cholesterol: 210 mg/dL — ABNORMAL HIGH (ref 0–200)
HDL: 44 mg/dL (ref >39)
LDL Cholesterol: 123 mg/dL — ABNORMAL HIGH (ref 0–99)
TRIGLYCERIDES: 214 mg/dL — AB (ref <150)
Total CHOL/HDL Ratio: 4.8 Ratio
VLDL: 43 mg/dL — ABNORMAL HIGH (ref 0–40)

## 2013-08-23 LAB — TSH: TSH: 2.994 u[IU]/mL (ref 0.350–4.500)

## 2013-08-25 ENCOUNTER — Other Ambulatory Visit: Payer: Self-pay | Admitting: *Deleted

## 2013-08-25 DIAGNOSIS — R748 Abnormal levels of other serum enzymes: Secondary | ICD-10-CM

## 2013-09-02 ENCOUNTER — Encounter: Payer: Self-pay | Admitting: Physician Assistant

## 2013-09-02 ENCOUNTER — Ambulatory Visit: Payer: Self-pay | Admitting: Family Medicine

## 2013-09-02 ENCOUNTER — Ambulatory Visit (INDEPENDENT_AMBULATORY_CARE_PROVIDER_SITE_OTHER): Payer: Self-pay | Admitting: Physician Assistant

## 2013-09-02 VITALS — BP 138/84 | HR 110 | Temp 98.0°F | Ht 67.0 in | Wt 184.0 lb

## 2013-09-02 DIAGNOSIS — I10 Essential (primary) hypertension: Secondary | ICD-10-CM

## 2013-09-02 DIAGNOSIS — H8110 Benign paroxysmal vertigo, unspecified ear: Secondary | ICD-10-CM

## 2013-09-02 DIAGNOSIS — R42 Dizziness and giddiness: Secondary | ICD-10-CM

## 2013-09-02 LAB — BASIC METABOLIC PANEL WITH GFR
BUN: 14 mg/dL (ref 6–23)
CHLORIDE: 100 meq/L (ref 96–112)
CO2: 28 meq/L (ref 19–32)
CREATININE: 0.96 mg/dL (ref 0.50–1.10)
Calcium: 10.4 mg/dL (ref 8.4–10.5)
GFR, Est African American: 73 mL/min
GFR, Est Non African American: 63 mL/min
Glucose, Bld: 91 mg/dL (ref 70–99)
Potassium: 4.6 mEq/L (ref 3.5–5.3)
Sodium: 141 mEq/L (ref 135–145)

## 2013-09-02 MED ORDER — MECLIZINE HCL 50 MG PO TABS: 50.0000 mg | ORAL_TABLET | Freq: Three times a day (TID) | ORAL | Status: DC | PRN

## 2013-09-02 NOTE — Patient Instructions (Addendum)
Antivert for nausea and dizziness as needed.  Do epley maneuvers 3 times a day. Will call with BMP. Stay hydrated.     Benign Positional Vertigo Vertigo means you feel like you or your surroundings are moving when they are not. Benign positional vertigo is the most common form of vertigo. Benign means that the cause of your condition is not serious. Benign positional vertigo is more common in older adults. CAUSES  Benign positional vertigo is the result of an upset in the labyrinth system. This is an area in the middle ear that helps control your balance. This may be caused by a viral infection, head injury, or repetitive motion. However, often no specific cause is found. SYMPTOMS  Symptoms of benign positional vertigo occur when you move your head or eyes in different directions. Some of the symptoms may include:  Loss of balance and falls.  Vomiting.  Blurred vision.  Dizziness.  Nausea.  Involuntary eye movements (nystagmus). DIAGNOSIS  Benign positional vertigo is usually diagnosed by physical exam. If the specific cause of your benign positional vertigo is unknown, your caregiver may perform imaging tests, such as magnetic resonance imaging (MRI) or computed tomography (CT). TREATMENT  Your caregiver may recommend movements or procedures to correct the benign positional vertigo. Medicines such as meclizine, benzodiazepines, and medicines for nausea may be used to treat your symptoms. In rare cases, if your symptoms are caused by certain conditions that affect the inner ear, you may need surgery. HOME CARE INSTRUCTIONS   Follow your caregiver's instructions.  Move slowly. Do not make sudden body or head movements.  Avoid driving.  Avoid operating heavy machinery.  Avoid performing any tasks that would be dangerous to you or others during a vertigo episode.  Drink enough fluids to keep your urine clear or pale yellow. SEEK IMMEDIATE MEDICAL CARE IF:   You develop  problems with walking, weakness, numbness, or using your arms, hands, or legs.  You have difficulty speaking.  You develop severe headaches.  Your nausea or vomiting continues or gets worse.  You develop visual changes.  Your family or friends notice any behavioral changes.  Your condition gets worse.  You have a fever.  You develop a stiff neck or sensitivity to light. MAKE SURE YOU:   Understand these instructions.  Will watch your condition.  Will get help right away if you are not doing well or get worse. Document Released: 04/24/2006 Document Revised: 10/09/2011 Document Reviewed: 04/06/2011 [facility] Patient Information 2014 [place].

## 2013-09-02 NOTE — Progress Notes (Signed)
   Subjective:    Patient ID: Margaret [name], female    DOB: 12-14-1950, 63 y.o.   MRN: [id number]  HPI Patient presents to the clinic with dizziness and elevated blood pressure. Patient notice she started feeling dizzy and she woke up Saturday morning which was 4 days ago. When she would look at the ceiling fan she felt like the whole room was spinning. Since then she has noticed that movement makes worse as well as laying down makes worse. She started HCTZ on 1/20 from Dr. Charise Carwin. She's concerned of blood pressure medicine could be causing dizziness. 11 AM she took her blood pressure at [facility] and was 147/113. She denies any chest pains, palpitations, headaches, sinus pressure, fever, cough, ear pain, shortness of breath or wheezing. She does have some nausea that accompanies dizziness. She has not fallen.   Review of Systems     Objective:   Physical Exam  Constitutional: She is oriented to person, place, and time. She appears well-developed and well-nourished.  HENT:  Head: Normocephalic and atraumatic.  Right Ear: External ear normal.  Left Ear: External ear normal.  Nose: Nose normal.  Mouth/Throat: Oropharynx is clear and moist.  TMs clear to palpation.  Eyes: Conjunctivae are normal. Right eye exhibits no discharge. Left eye exhibits no discharge.  Neck: Normal range of motion. Neck supple.  Cardiovascular: Normal rate, regular rhythm and normal heart sounds.   Pulmonary/Chest: Effort normal and breath sounds normal. She has no wheezes.  Lymphadenopathy:    She has no cervical adenopathy.  Neurological: She is oriented to person, place, and time. No cranial nerve deficit. Coordination abnormal.  Marye Round positive with head to the left. Some abnormal coordination when getting on the table.  Skin: Skin is dry.  Psychiatric: She has a normal mood and affect. Her behavior is normal.          Assessment & Plan:  Hypertension-recheck BP in office and was  first 143/88 and then checked again at 138/84. I do not think dizziness is in correlation to hypertension. I feel it there is another cause. Potentially HCTZ could be drying her out too much and causing some dizziness Will check a BMP today to look at electrolytes. As of now continue on HCTZ and stay hydrated. I would like for patient to followup for BP recheck in 1 week with Dr. Charise Carwin to make sure dizziness is improving and blood pressure is staying regulated.  BPV/dizziness-Dix Hallpike was positive with head turned to the left. Patient was educated of diagnosis and given handouts. I discussed that the maneuver and whenever how to do them. Encouraged her to do these maneuvers up to 3 times a day until symptom-free for 24 hours. An over was also given to use as needed for nausea and dizziness. We'll continue to monitor her progress. Call if dizziness is worsening. I'm also checking a BMP to make sure electrolytes are in check.

## 2013-09-03 ENCOUNTER — Ambulatory Visit: Payer: Self-pay | Admitting: Physician Assistant

## 2013-09-03 DIAGNOSIS — R42 Dizziness and giddiness: Secondary | ICD-10-CM | POA: Insufficient documentation

## 2013-09-09 ENCOUNTER — Ambulatory Visit: Payer: Self-pay | Admitting: Family Medicine

## 2013-09-09 DIAGNOSIS — Z0289 Encounter for other administrative examinations: Secondary | ICD-10-CM

## 2013-11-14 ENCOUNTER — Other Ambulatory Visit: Payer: Self-pay | Admitting: Family Medicine

## 2014-02-10 ENCOUNTER — Other Ambulatory Visit: Payer: Self-pay | Admitting: Family Medicine

## 2014-02-16 ENCOUNTER — Ambulatory Visit: Payer: Self-pay | Admitting: Family Medicine

## 2014-05-11 ENCOUNTER — Other Ambulatory Visit: Payer: Self-pay | Admitting: Family Medicine

## 2014-08-18 ENCOUNTER — Ambulatory Visit (INDEPENDENT_AMBULATORY_CARE_PROVIDER_SITE_OTHER): Payer: Self-pay | Admitting: Family Medicine

## 2014-08-18 ENCOUNTER — Encounter: Payer: Self-pay | Admitting: Family Medicine

## 2014-08-18 VITALS — BP 134/84 | HR 103 | Temp 97.4°F | Ht 67.0 in | Wt 176.0 lb

## 2014-08-18 DIAGNOSIS — J029 Acute pharyngitis, unspecified: Secondary | ICD-10-CM

## 2014-08-18 DIAGNOSIS — E785 Hyperlipidemia, unspecified: Secondary | ICD-10-CM

## 2014-08-18 DIAGNOSIS — I10 Essential (primary) hypertension: Secondary | ICD-10-CM

## 2014-08-18 DIAGNOSIS — K219 Gastro-esophageal reflux disease without esophagitis: Secondary | ICD-10-CM

## 2014-08-18 DIAGNOSIS — F339 Major depressive disorder, recurrent, unspecified: Secondary | ICD-10-CM

## 2014-08-18 DIAGNOSIS — M810 Age-related osteoporosis without current pathological fracture: Secondary | ICD-10-CM

## 2014-08-18 LAB — POCT RAPID STREP A (OFFICE): Rapid Strep A Screen: NEGATIVE

## 2014-08-18 NOTE — Progress Notes (Addendum)
   Subjective:    Patient ID: Margaret [name], female    DOB: [birth year], 64 y.o.   MRN: [id number]  HPI Hypertension- Pt denies chest pain, SOB, dizziness, or heart palpitations.  Taking meds as directed w/o problems.  Denies medication side effects.    Follow-up depression-she does complain of little pleasure interest in doing things several days of the week and feeling down several days of the week. She denies any thoughts of wanting to harm herself or difficulty concentrating. She denies any sleep issues or concerns. We restarted her fluoxetine about a year ago.  GERD - just uses omeprazole PRN if she knows she is going to eat something that will flare her reflux.  Hyperlipidemia - no myalgias or S.E. tolerating statin well.  Really ST x 4 days.  No fever, chills o sweets. Bilat ear pain.  No drainage from the ears. No sinus sxs or cough. Using Tylenol for pain relief. This has been helping some.  Review of Systems     Objective:   Physical Exam  Constitutional: She is oriented to person, place, and time. She appears well-developed and well-nourished.  HENT:  Head: Normocephalic and atraumatic.  Right Ear: External ear normal.  Left Ear: External ear normal.  Nose: Nose normal.  Mouth/Throat: Oropharynx is clear and moist.  TMs and canals are clear.   Eyes: Conjunctivae and EOM are normal. Pupils are equal, round, and reactive to light.  Neck: Neck supple. No thyromegaly present.  Cardiovascular: Normal rate, regular rhythm and normal heart sounds.   Pulmonary/Chest: Effort normal and breath sounds normal. She has no wheezes.  Lymphadenopathy:    She has no cervical adenopathy.  Neurological: She is alert and oriented to person, place, and time.  Skin: Skin is warm and dry.  Psychiatric: She has a normal mood and affect.          Assessment & Plan:  Hypertension-well-controlled. Continue current regimen. Follow-up in 6 months.  Depression-PHQ 9 score of 5 today. She  rates her symptoms as not difficult at all. Overall fairly well controlled. We will continue current regimen. Follow-up in 6 months. Continue fluoxetine for now. She is on a very small dose or certainly if she feels it is an increase in symptoms then please call the office and we can always bump her up to 20 mg.  GERD-well-controlled with just when necessary dosing of her PPI. Next  Hyperlipidemia-tolerating her statin well. Next  Pharyngitis-likely viral. Strep test was negative. Call if not better in one week   Osteoporosis-just make sure taking calcium and vitamin D with her bisphosphonate. She said she had recently stopped her calcium. Will recheck vitamin D level.

## 2014-08-19 ENCOUNTER — Encounter: Payer: Self-pay | Admitting: Family Medicine

## 2014-08-19 NOTE — Addendum Note (Signed)
Addended by: Beatrice Lecher D on: 08/19/2014 02:01 PM   Modules accepted: Level of Service

## 2014-08-25 ENCOUNTER — Other Ambulatory Visit: Payer: Self-pay | Admitting: Family Medicine

## 2014-08-25 NOTE — Telephone Encounter (Signed)
Pt called and stated that

## 2014-09-04 ENCOUNTER — Telehealth: Payer: Self-pay

## 2014-09-04 MED ORDER — ZOLPIDEM TARTRATE 5 MG PO TABS: 5.0000 mg | ORAL_TABLET | Freq: Every evening | ORAL | Status: DC | PRN

## 2014-09-04 NOTE — Telephone Encounter (Signed)
Patient left a message yesterday asking for a refill on Ambien. She states she did not tell the nurse she was taking this medication. Not on current medication list. Please advise.

## 2014-09-04 NOTE — Telephone Encounter (Signed)
OK, rx sent to [facility]

## 2014-10-05 ENCOUNTER — Other Ambulatory Visit: Payer: Self-pay | Admitting: Family Medicine

## 2014-11-18 ENCOUNTER — Other Ambulatory Visit: Payer: Self-pay | Admitting: Family Medicine

## 2014-12-02 ENCOUNTER — Other Ambulatory Visit: Payer: Self-pay | Admitting: Family Medicine

## 2015-03-08 ENCOUNTER — Other Ambulatory Visit: Payer: Self-pay | Admitting: Sports Medicine

## 2015-04-12 ENCOUNTER — Other Ambulatory Visit: Payer: Self-pay | Admitting: Family Medicine

## 2015-04-13 ENCOUNTER — Other Ambulatory Visit: Payer: Self-pay | Admitting: *Deleted

## 2015-04-13 MED ORDER — ZOLPIDEM TARTRATE 5 MG PO TABS: 5.0000 mg | ORAL_TABLET | Freq: Every evening | ORAL | Status: DC | PRN

## 2015-04-20 ENCOUNTER — Ambulatory Visit: Payer: Self-pay | Admitting: Family Medicine

## 2015-04-26 ENCOUNTER — Encounter: Payer: Self-pay | Admitting: Family Medicine

## 2015-04-26 ENCOUNTER — Ambulatory Visit (INDEPENDENT_AMBULATORY_CARE_PROVIDER_SITE_OTHER): Payer: Medicare Other | Admitting: Family Medicine

## 2015-04-26 VITALS — BP 126/94 | HR 92 | Ht 67.0 in | Wt 175.0 lb

## 2015-04-26 DIAGNOSIS — R7309 Other abnormal glucose: Secondary | ICD-10-CM | POA: Diagnosis not present

## 2015-04-26 DIAGNOSIS — Z23 Encounter for immunization: Secondary | ICD-10-CM | POA: Diagnosis not present

## 2015-04-26 DIAGNOSIS — E785 Hyperlipidemia, unspecified: Secondary | ICD-10-CM | POA: Diagnosis not present

## 2015-04-26 DIAGNOSIS — F32A Depression, unspecified: Secondary | ICD-10-CM

## 2015-04-26 DIAGNOSIS — H04129 Dry eye syndrome of unspecified lacrimal gland: Secondary | ICD-10-CM | POA: Insufficient documentation

## 2015-04-26 DIAGNOSIS — F329 Major depressive disorder, single episode, unspecified: Secondary | ICD-10-CM

## 2015-04-26 DIAGNOSIS — M810 Age-related osteoporosis without current pathological fracture: Secondary | ICD-10-CM | POA: Diagnosis not present

## 2015-04-26 DIAGNOSIS — K219 Gastro-esophageal reflux disease without esophagitis: Secondary | ICD-10-CM | POA: Diagnosis not present

## 2015-04-26 DIAGNOSIS — Z1231 Encounter for screening mammogram for malignant neoplasm of breast: Secondary | ICD-10-CM

## 2015-04-26 DIAGNOSIS — N951 Menopausal and female climacteric states: Secondary | ICD-10-CM

## 2015-04-26 DIAGNOSIS — Z780 Asymptomatic menopausal state: Secondary | ICD-10-CM

## 2015-04-26 DIAGNOSIS — I10 Essential (primary) hypertension: Secondary | ICD-10-CM

## 2015-04-26 DIAGNOSIS — H269 Unspecified cataract: Secondary | ICD-10-CM | POA: Diagnosis not present

## 2015-04-26 DIAGNOSIS — H04123 Dry eye syndrome of bilateral lacrimal glands: Secondary | ICD-10-CM

## 2015-04-26 DIAGNOSIS — R7301 Impaired fasting glucose: Secondary | ICD-10-CM

## 2015-04-26 LAB — BASIC METABOLIC PANEL
BUN: 18 mg/dL (ref 7–25)
CALCIUM: 9.5 mg/dL (ref 8.6–10.4)
CO2: 28 mmol/L (ref 20–31)
CREATININE: 0.87 mg/dL (ref 0.50–0.99)
Chloride: 105 mmol/L (ref 98–110)
GLUCOSE: 96 mg/dL (ref 65–99)
Potassium: 4.4 mmol/L (ref 3.5–5.3)
Sodium: 139 mmol/L (ref 135–146)

## 2015-04-26 LAB — POCT GLYCOSYLATED HEMOGLOBIN (HGB A1C): Hemoglobin A1C: 5.4

## 2015-04-26 NOTE — Progress Notes (Signed)
Subjective:    Patient ID: Margaret [name], female    DOB: 1950/08/17, 64 y.o.   MRN: [id number]  HPI Hypertension- Pt denies chest pain, SOB, dizziness, or heart palpitations.  Taking meds as directed w/o problems.  Denies medication side effects.    F/U depression - still on 10mg  a day and works well to control her mood.  She feels like she's been under a little bit more stress recently because she has been renting her home for the last 6 years and the lady who onset has decided to sell it. And so she has approximately 4 weeks to move out of her home.  IFG - No inc thirst or uraintion.    Hyperlpidemia- doing well on statin. She never went for labs in January.    She also has a skin lesion on her right lower leg below the knee it is been there almost the entire summer and has not healed. It has a little bit of yellow oozing from it. No surrounding erythema. No fevers chills or sweats. She has had skin cancer before.  Review of Systems BP 126/94 mmHg  Pulse 92  Ht 5\' 7"  (1.702 m)  Wt 175 lb (79.379 kg)  BMI 27.40 kg/m2    Allergies  Allergen Reactions  . Codeine   . Diphenhydramine Hcl   . Fish Oil     REACTION: vomiting.    Past Medical History  Diagnosis Date  . Hyperlipidemia   . Hypertension   . Impaired fasting glucose   . GERD (gastroesophageal reflux disease)     Past Surgical History  Procedure Laterality Date  . Cystocele repair    . Vaginal hysterectomy    . Bladder tack    . Pilonidal cyst excision      Social History   Social History  . Marital Status: Single    Spouse Name: N/A  . Number of Children: N/A  . Years of Education: N/A   Occupational History  . Not on file.   Social History Main Topics  . Smoking status: Former Smoker    Types: Cigarettes  . Smokeless tobacco: Not on file  . Alcohol Use: No  . Drug Use: No  . Sexual Activity: Not on file   Other Topics Concern  . Not on file   Social History Narrative   Reviewed history  from 10/13/2009 and no changes required.   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    Drug use-no   Regular exercise-no, at work   Diet: "graze during the day" no fruit, lots of veggies    Family History  Problem Relation Age of Onset  . Asthma    . Depression    . Stomach cancer Father 58    deceased  . Diabetes Father   . Cancer Father     stomach  . Bipolar disorder    . Bell's palsy Mother   . Parkinsonism Mother     died age 73  . Dementia Mother   . Asthma Mother   . Diabetes Sister   . Diabetes Brother     Outpatient Encounter Prescriptions as of 04/26/2015  Medication Sig  . alendronate (FOSAMAX) 70 MG tablet TAKE 1 TABLET WEEKLY TAKE ON EMPTY STOMACH WITH 8 OZ OF WATER (DO NOT LIE DOWN FOR 30 MIN.)  . B Complex-C (SUPER B COMPLEX PO) Take by mouth.  Marland Kitchen  Calcium Carbonate-Vit D-Min (CALCIUM 1200 PO) Take 2 capsules by mouth daily.  Marland Kitchen FLUoxetine (PROZAC) 10 MG capsule TAKE 1 CAPSULE DAILY  . hydrochlorothiazide (MICROZIDE) 12.5 MG capsule TAKE 1 CAPSULE IN THE MORNING  . hydrocortisone 2.5 % cream Apply topically daily as needed.  . loratadine (CLARITIN) 10 MG tablet Take 10 mg by mouth daily.  . Multiple Vitamins-Minerals (CENTRUM SILVER ADULT 50+ PO) Take 1 capsule by mouth daily.  . ranitidine (ZANTAC) 150 MG tablet Take 150 mg by mouth 2 (two) times daily.  . simvastatin (ZOCOR) 40 MG tablet TAKE 1 TABLET AT BEDTIME.  Marland Kitchen triamcinolone ointment (KENALOG) 0.5 % Apply topically 2 (two) times daily as needed to the rectal area  . zolpidem (AMBIEN) 5 MG tablet Take 1 tablet (5 mg total) by mouth at bedtime as needed. NO ADDITIONAL REFILLS WILL BE GIVEN UNTIL PATIENT IS SEEN  . [DISCONTINUED] omeprazole (PRILOSEC OTC) 20 MG tablet Take 20 mg by mouth as needed.   No facility-administered encounter medications on file as of 04/26/2015.          Objective:   Physical Exam   Constitutional: She is oriented to person, place, and time. She appears well-developed and well-nourished. She appears distressed.  HENT:  Head: Normocephalic and atraumatic.  Right Ear: External ear normal.  Left Ear: External ear normal.  Eyes: Conjunctivae are normal.  Cardiovascular: Normal rate, regular rhythm and normal heart sounds.   Pulmonary/Chest: Effort normal and breath sounds normal.  Musculoskeletal: She exhibits no edema.  Neurological: She is alert and oriented to person, place, and time.  Skin: Skin is warm and dry.  approx 1 cm scabbed lesion with some serous drainage on the bandaid.   Psychiatric: She has a normal mood and affect. Her behavior is normal.          Assessment & Plan:  HTN - well controlled. Continue current regimen. Follow-up in 6 months.  Depression - well-controlled. PHQ- 9 score of 6 today.  Continue current regimen. Follow-up in 6 months.  IFG - will check a hemoglobin A1c and keep track on this.  Hyperlipidemia - due to recheck lipids and liver enzymes.  Lesion not healing on right lower leg-recommend that she come back in for biopsy for further evaluation.  Mammogram and bone density ordered as well today. Given Prevnar 13 today.

## 2015-04-27 NOTE — Progress Notes (Signed)
Quick Note:  All labs are normal. ______ 

## 2015-05-03 ENCOUNTER — Other Ambulatory Visit: Payer: Self-pay | Admitting: Family Medicine

## 2015-05-03 ENCOUNTER — Ambulatory Visit (INDEPENDENT_AMBULATORY_CARE_PROVIDER_SITE_OTHER): Payer: Medicare Other | Admitting: Family Medicine

## 2015-05-03 ENCOUNTER — Encounter: Payer: Self-pay | Admitting: Family Medicine

## 2015-05-03 VITALS — BP 126/76 | HR 91 | Wt 176.0 lb

## 2015-05-03 DIAGNOSIS — L989 Disorder of the skin and subcutaneous tissue, unspecified: Secondary | ICD-10-CM

## 2015-05-03 DIAGNOSIS — C44712 Basal cell carcinoma of skin of right lower limb, including hip: Secondary | ICD-10-CM | POA: Diagnosis not present

## 2015-05-03 NOTE — Progress Notes (Signed)
   Subjective:    Patient ID: Margaret [name], female    DOB: Aug 02, 1950, 64 y.o.   MRN: [id number]  HPI She also has a skin lesion on her right lower leg below the knee it is been there almost the entire summer and has not healed. It has a little bit of yellow oozing from it. No surrounding erythema. No fevers chills or sweats. She has had skin cancer before, she thinks was basal cell.  The wound has seemed to stay wet.  .  Review of Systems     Objective:   Physical Exam  Constitutional: She appears well-developed and well-nourished.  HENT:  Head: Normocephalic and atraumatic.  Skin:  1.5 x 2 cm erythematous papular lesion on the right lower leg just below her knee. Some fresh blood on the surface.  No pus.  Border well demarcated.  No surrounding erythema.           Assessment & Plan:  Atypical skin lesion, worrisome for skin carcinoma. Shave biopsy performed. Patient tolerated well. Follow-up when care discussed. Call if any signs of infection.  Shave Biopsy Procedure Note  Pre-operative Diagnosis: Suspicious lesion  Post-operative Diagnosis: same  Locations:right lower extremity  Indications: not healing x 2 months   Anesthesia: Lidocaine 1% with epinephrine without added sodium bicarbonate  Procedure Details  History of allergy to iodine: no  Patient informed of the risks (including bleeding and infection) and benefits of the  procedure and Verbal informed consent obtained.  The lesion and surrounding area were given a sterile prep using chlorhexidine and draped in the usual sterile fashion. A scalpel was used to shave an area of skin approximately 1.5cm by 2cm.  Hemostasis achieved with alumuninum chloride. Antibiotic ointment and a sterile dressing applied.  The specimen was sent for pathologic examination. The patient tolerated the procedure well.  EBL: minimal/trace  Findings: await pathology  Condition: Stable  Complications: none.  Plan: 1.  Instructed to keep the wound dry and covered for 24-48h and clean thereafter. 2. Warning signs of infection were reviewed.   3. Recommended that the patient use OTC acetaminophen as needed for pain.  4. Return PRN.

## 2015-05-04 ENCOUNTER — Other Ambulatory Visit: Payer: Self-pay

## 2015-05-04 MED ORDER — HYDROCHLOROTHIAZIDE 12.5 MG PO CAPS: 12.5000 mg | ORAL_CAPSULE | Freq: Every morning | ORAL | Status: DC

## 2015-05-04 MED ORDER — SIMVASTATIN 40 MG PO TABS: 40.0000 mg | ORAL_TABLET | Freq: Every day | ORAL | Status: DC

## 2015-05-04 MED ORDER — FLUOXETINE HCL 10 MG PO CAPS: 10.0000 mg | ORAL_CAPSULE | Freq: Every day | ORAL | Status: DC

## 2015-05-04 NOTE — Telephone Encounter (Signed)
Refills sent to [facility].

## 2015-05-06 ENCOUNTER — Other Ambulatory Visit: Payer: Self-pay | Admitting: Family Medicine

## 2015-05-07 ENCOUNTER — Telehealth: Payer: Self-pay | Admitting: Family Medicine

## 2015-05-07 DIAGNOSIS — C4491 Basal cell carcinoma of skin, unspecified: Secondary | ICD-10-CM

## 2015-05-07 NOTE — Telephone Encounter (Signed)
Pt called. She wanted to know about referral to dermatology for area on her leg? She said Ebony left msg for her. Thank you

## 2015-05-07 NOTE — Telephone Encounter (Signed)
See Derm report.   Left a message for a return call.

## 2015-05-07 NOTE — Telephone Encounter (Signed)
Patient would like to go ahead with derm referral. She would like someone in [place] and that takes Medicare.

## 2015-05-08 NOTE — Telephone Encounter (Signed)
Referral placed.

## 2015-05-17 DIAGNOSIS — Z85828 Personal history of other malignant neoplasm of skin: Secondary | ICD-10-CM | POA: Diagnosis not present

## 2015-05-17 DIAGNOSIS — L570 Actinic keratosis: Secondary | ICD-10-CM | POA: Diagnosis not present

## 2015-05-17 DIAGNOSIS — Z08 Encounter for follow-up examination after completed treatment for malignant neoplasm: Secondary | ICD-10-CM | POA: Diagnosis not present

## 2015-06-05 ENCOUNTER — Other Ambulatory Visit: Payer: Self-pay | Admitting: Family Medicine

## 2015-07-15 ENCOUNTER — Other Ambulatory Visit: Payer: Self-pay | Admitting: Family Medicine

## 2015-07-30 ENCOUNTER — Encounter: Payer: Self-pay | Admitting: Physician Assistant

## 2015-07-30 ENCOUNTER — Ambulatory Visit (INDEPENDENT_AMBULATORY_CARE_PROVIDER_SITE_OTHER): Payer: Medicare Other | Admitting: Physician Assistant

## 2015-07-30 VITALS — BP 120/80 | HR 98 | Ht 67.0 in | Wt 183.0 lb

## 2015-07-30 DIAGNOSIS — L03032 Cellulitis of left toe: Secondary | ICD-10-CM

## 2015-07-30 DIAGNOSIS — S90412A Abrasion, left great toe, initial encounter: Secondary | ICD-10-CM

## 2015-07-30 DIAGNOSIS — L089 Local infection of the skin and subcutaneous tissue, unspecified: Secondary | ICD-10-CM | POA: Diagnosis not present

## 2015-07-30 MED ORDER — DOXYCYCLINE HYCLATE 100 MG PO TABS: 100.0000 mg | ORAL_TABLET | Freq: Two times a day (BID) | ORAL | Status: DC

## 2015-07-30 MED ORDER — HYDROCODONE-ACETAMINOPHEN 5-325 MG PO TABS: 1.0000 | ORAL_TABLET | Freq: Three times a day (TID) | ORAL | Status: DC | PRN

## 2015-07-30 NOTE — Patient Instructions (Signed)
Paronychia °Paronychia is an infection of the skin that surrounds a nail. It usually affects the skin around a fingernail, but it may also occur near a toenail. It often causes pain and swelling around the nail. This condition may come on suddenly or develop over a longer period. In some cases, a collection of pus (abscess) can form near or under the nail. Usually, paronychia is not serious and it clears up with treatment. °CAUSES °This condition may be caused by bacteria or fungi. It is commonly caused by either Streptococcus or Staphylococcus bacteria. The bacteria or fungi often cause the infection by getting into the affected area through an opening in the skin, such as a cut or a hangnail. °RISK FACTORS °This condition is more likely to develop in: °· People who get their hands wet often, such as those who work as dishwashers, bartenders, or nurses. °· People who bite their fingernails or suck their thumbs. °· People who trim their nails too short. °· People who have hangnails or injured fingertips. °· People who get manicures. °· People who have diabetes. °SYMPTOMS °Symptoms of this condition include: °· Redness and swelling of the skin near the nail. °· Tenderness around the nail when you touch the area. °· Pus-filled bumps under the cuticle. The cuticle is the skin at the base or sides of the nail. °· Fluid or pus under the nail. °· Throbbing pain in the area. °DIAGNOSIS °This condition is usually diagnosed with a physical exam. In some cases, a sample of pus may be taken from an abscess to be tested in a lab. This can help to determine what type of bacteria or fungi is causing the condition. °TREATMENT °Treatment for this condition depends on the cause and severity of the condition. If the condition is mild, it may clear up on its own in a few days. Your health care provider may recommend soaking the affected area in warm water a few times a day. When treatment is needed, the options may  include: °· Antibiotic medicine, if the condition is caused by a bacterial infection. °· Antifungal medicine, if the condition is caused by a fungal infection. °· Incision and drainage, if an abscess is present. In this procedure, the health care provider will cut open the abscess so the pus can drain out. °HOME CARE INSTRUCTIONS °· Soak the affected area in warm water if directed to do so by your health care provider. You may be told to do this for 20 minutes, 2-3 times a day. Keep the area dry in between soakings. °· Take medicines only as directed by your health care provider. °· If you were prescribed an antibiotic medicine, finish all of it even if you start to feel better. °· Keep the affected area clean. °· Do not try to drain a fluid-filled bump yourself. °· If you will be washing dishes or performing other tasks that require your hands to get wet, wear rubber gloves. You should also wear gloves if your hands might come in contact with irritating substances, such as cleaners or chemicals. °· Follow your health care provider's instructions about: °¨ Wound care. °¨ Bandage (dressing) changes and removal. °SEEK MEDICAL CARE IF: °· Your symptoms get worse or do not improve with treatment. °· You have a fever or chills. °· You have redness spreading from the affected area. °· You have continued or increased fluid, blood, or pus coming from the affected area. °· Your finger or knuckle becomes swollen or is difficult to move. °  °  This information is not intended to replace advice given to you by your health care provider. Make sure you discuss any questions you have with your health care provider. °  °Document Released: 01/10/2001 Document Revised: 12/01/2014 Document Reviewed: 06/24/2014 °Elsevier Interactive Patient Education ©2016 Elsevier Inc. ° °

## 2015-07-30 NOTE — Progress Notes (Signed)
   Subjective:    Patient ID: Margaret [name], female    DOB: [birth year], 64 y.o.   MRN: [id number]  HPI Pt is a 64 yo female who presents to the clinic with left great toe redness, tenderness, swelling and pus around cuticle. Started about 3 days ago. Seems to be draining more pus. She has a hx of ingrown toenails which she thought it was but does not hurt as bad.pain however is keeping her up at night.  Soaking in epson salts but does not seem to be helping. No fever, chills, n/v/d.    Review of Systems  All other systems reviewed and are negative.      Objective:   Physical Exam  Constitutional: She is oriented to person, place, and time. She appears well-developed and well-nourished.  Neurological: She is alert and oriented to person, place, and time.  Skin:     Psychiatric: She has a normal mood and affect. Her behavior is normal.          Assessment & Plan:  Paronchycia/abrasion- appears to have an abrasion that likely started infection. I do not think ingrown toenail. Treated with doxycycline and small quantity of norco for pain to help sleep. Continue salt water soaks. Follow up Tuesday for recheck.

## 2015-08-03 ENCOUNTER — Ambulatory Visit: Payer: Medicare Other | Admitting: Sports Medicine

## 2015-08-04 ENCOUNTER — Ambulatory Visit (INDEPENDENT_AMBULATORY_CARE_PROVIDER_SITE_OTHER): Payer: Medicare Other | Admitting: Physician Assistant

## 2015-08-04 ENCOUNTER — Encounter: Payer: Self-pay | Admitting: Physician Assistant

## 2015-08-04 VITALS — BP 140/100 | HR 96 | Ht 67.0 in | Wt 179.0 lb

## 2015-08-04 DIAGNOSIS — L600 Ingrowing nail: Secondary | ICD-10-CM

## 2015-08-04 NOTE — Progress Notes (Signed)
   Subjective:    Patient ID: Margaret [name], female    DOB: [birth year], 65 y.o.   MRN: [id number]  HPI Pt is a 65 yo female who presents back to the clinic with her left great toe. She certainly had infection and doxycycline has helped. She continues to have some pain at the base laterally of left great toe. She has been doing soaks twice a day. Hx of ingrown toenails.    Review of Systems  All other systems reviewed and are negative.      Objective:   Physical Exam  Constitutional: She appears well-developed and well-nourished.  Skin:     Psychiatric: She has a normal mood and affect. Her behavior is normal.          Assessment & Plan:  Left ingrown toenail-   Toenail Avulsion Procedure Note  Pre-operative Diagnosis: Left Ingrown Great toenail   Post-operative Diagnosis: Left Ingrown Great toenail  Indications: pain/infection  Anesthesia: Lidocaine 1% without epinephrine without added sodium bicarbonate  Procedure Details  History of allergy to iodine: no  The risks (including bleeding and infection) and benefits of the  procedure and Verbal informed consent obtained.  After digital block anesthesia was obtained, a tourniquet was applied for hemostasis during the procedure.  After prepping with Betadine, the offending edge of the nail was freed from the nailbed and perionychium, and then split with scissors and removed with  forceps.  All visible granulation tissue is debrided. Antibiotic and bulky dressing was applied.   Findings: Ingrown toenail  Complications: none.  Plan: 1. Soak the foot twice daily. Change dressing twice daily until healed over. 2. Warning signs of infection were reviewed.   3. Recommended that the patient use OTC acetaminophen as needed for pain.  4. As needed.

## 2015-08-04 NOTE — Patient Instructions (Signed)
Ingrown Toenail  An ingrown toenail occurs when the corner or sides of your toenail grow into the surrounding skin. The big toe is most commonly affected, but it can happen to any of your toes. If your ingrown toenail is not treated, you will be at risk for infection.  CAUSES  This condition may be caused by:  · Wearing shoes that are too small or tight.  · Injury or trauma, such as stubbing your toe or having your toe stepped on.  · Improper cutting or care of your toenails.  · Being born with (congenital) nail or foot abnormalities, such as having a nail that is too big for your toe.  RISK FACTORS  Risk factors for an ingrown toenail include:  · Age. Your nails tend to thicken as you get older, so ingrown nails are more common in older people.  · Diabetes.  · Cutting your toenails incorrectly.  · Blood circulation problems.  SYMPTOMS  Symptoms may include:  · Pain, soreness, or tenderness.  · Redness.  · Swelling.  · Hardening of the skin surrounding the toe.  Your ingrown toenail may be infected if there is fluid, pus, or drainage.  DIAGNOSIS   An ingrown toenail may be diagnosed by medical history and physical exam. If your toenail is infected, your health care provider may test a sample of the drainage.  TREATMENT  Treatment depends on the severity of your ingrown toenail. Some ingrown toenails may be treated at home. More severe or infected ingrown toenails may require surgery to remove all or part of the nail. Infected ingrown toenails may also be treated with antibiotic medicines.  HOME CARE INSTRUCTIONS  · If you were prescribed an antibiotic medicine, finish all of it even if you start to feel better.  · Soak your foot in warm soapy water for 20 minutes, 3 times per day or as directed by your health care provider.  · Carefully lift the edge of the nail away from the sore skin by wedging a small piece of cotton under the corner of the nail. This may help with the pain.  Be careful not to cause more injury  to the area.  · Wear shoes that fit well. If your ingrown toenail is causing you pain, try wearing sandals, if possible.  · Trim your toenails regularly and carefully. Do not cut them in a curved shape. Cut your toenails straight across. This prevents injury to the skin at the corners of the toenail.  · Keep your feet clean and dry.  · If you are having trouble walking and are given crutches by your health care provider, use them as directed.  · Do not pick at your toenail or try to remove it yourself.  · Take medicines only as directed by your health care provider.  · Keep all follow-up visits as directed by your health care provider. This is important.  SEEK MEDICAL CARE IF:  · Your symptoms do not improve with treatment.  SEEK IMMEDIATE MEDICAL CARE IF:  · You have red streaks that start at your foot and go up your leg.  · You have a fever.  · You have increased redness, swelling, or pain.  · You have fluid, blood, or pus coming from your toenail.     This information is not intended to replace advice given to you by your health care provider. Make sure you discuss any questions you have with your health care provider.     Document Released:   07/14/2000 Document Revised: 12/01/2014 Document Reviewed: 06/10/2014  Elsevier Interactive Patient Education ©2016 Elsevier Inc.

## 2015-08-12 ENCOUNTER — Other Ambulatory Visit: Payer: Self-pay | Admitting: Family Medicine

## 2015-09-21 ENCOUNTER — Other Ambulatory Visit: Payer: Self-pay | Admitting: Family Medicine

## 2015-10-13 ENCOUNTER — Other Ambulatory Visit: Payer: Self-pay | Admitting: Family Medicine

## 2015-10-25 ENCOUNTER — Ambulatory Visit: Payer: Medicare Other | Admitting: Family Medicine

## 2015-11-15 DIAGNOSIS — C44712 Basal cell carcinoma of skin of right lower limb, including hip: Secondary | ICD-10-CM | POA: Diagnosis not present

## 2015-11-22 DIAGNOSIS — L02415 Cutaneous abscess of right lower limb: Secondary | ICD-10-CM | POA: Diagnosis not present

## 2015-11-29 DIAGNOSIS — D485 Neoplasm of uncertain behavior of skin: Secondary | ICD-10-CM | POA: Diagnosis not present

## 2015-11-29 DIAGNOSIS — C44511 Basal cell carcinoma of skin of breast: Secondary | ICD-10-CM | POA: Diagnosis not present

## 2015-12-06 ENCOUNTER — Other Ambulatory Visit: Payer: Self-pay | Admitting: Family Medicine

## 2016-03-01 ENCOUNTER — Telehealth: Payer: Self-pay | Admitting: *Deleted

## 2016-03-01 ENCOUNTER — Other Ambulatory Visit: Payer: Self-pay | Admitting: *Deleted

## 2016-03-01 ENCOUNTER — Other Ambulatory Visit: Payer: Self-pay | Admitting: Family Medicine

## 2016-03-01 DIAGNOSIS — Z85828 Personal history of other malignant neoplasm of skin: Secondary | ICD-10-CM | POA: Diagnosis not present

## 2016-03-01 DIAGNOSIS — L821 Other seborrheic keratosis: Secondary | ICD-10-CM | POA: Diagnosis not present

## 2016-03-01 DIAGNOSIS — C44612 Basal cell carcinoma of skin of right upper limb, including shoulder: Secondary | ICD-10-CM | POA: Diagnosis not present

## 2016-03-01 DIAGNOSIS — Z08 Encounter for follow-up examination after completed treatment for malignant neoplasm: Secondary | ICD-10-CM | POA: Diagnosis not present

## 2016-03-01 NOTE — Telephone Encounter (Signed)
Called and lvm advising pt that she will need to make a f/u appt for BP and cholesterol and to have some lab work done. Advised to call to make an appt.Margaret [name] [place]

## 2016-03-07 DIAGNOSIS — M5416 Radiculopathy, lumbar region: Secondary | ICD-10-CM | POA: Diagnosis not present

## 2016-03-07 DIAGNOSIS — M47817 Spondylosis without myelopathy or radiculopathy, lumbosacral region: Secondary | ICD-10-CM | POA: Diagnosis not present

## 2016-03-07 DIAGNOSIS — R51 Headache: Secondary | ICD-10-CM | POA: Diagnosis not present

## 2016-03-07 DIAGNOSIS — M5137 Other intervertebral disc degeneration, lumbosacral region: Secondary | ICD-10-CM | POA: Diagnosis not present

## 2016-03-14 ENCOUNTER — Encounter: Payer: Self-pay | Admitting: Family Medicine

## 2016-03-14 ENCOUNTER — Ambulatory Visit (INDEPENDENT_AMBULATORY_CARE_PROVIDER_SITE_OTHER): Payer: Medicare Other | Admitting: Family Medicine

## 2016-03-14 VITALS — BP 118/78 | HR 102 | Resp 15 | Ht 67.0 in | Wt 174.0 lb

## 2016-03-14 DIAGNOSIS — I10 Essential (primary) hypertension: Secondary | ICD-10-CM | POA: Diagnosis not present

## 2016-03-14 DIAGNOSIS — F339 Major depressive disorder, recurrent, unspecified: Secondary | ICD-10-CM

## 2016-03-14 DIAGNOSIS — Z85828 Personal history of other malignant neoplasm of skin: Secondary | ICD-10-CM | POA: Insufficient documentation

## 2016-03-14 DIAGNOSIS — E785 Hyperlipidemia, unspecified: Secondary | ICD-10-CM | POA: Diagnosis not present

## 2016-03-14 DIAGNOSIS — E559 Vitamin D deficiency, unspecified: Secondary | ICD-10-CM | POA: Diagnosis not present

## 2016-03-14 DIAGNOSIS — E348 Other specified endocrine disorders: Secondary | ICD-10-CM

## 2016-03-14 LAB — POCT GLYCOSYLATED HEMOGLOBIN (HGB A1C): Hemoglobin A1C: 5.2

## 2016-03-14 MED ORDER — SIMVASTATIN 40 MG PO TABS: 40.0000 mg | ORAL_TABLET | Freq: Every day | ORAL | 4 refills | Status: DC

## 2016-03-14 MED ORDER — HYDROCHLOROTHIAZIDE 12.5 MG PO CAPS: 12.5000 mg | ORAL_CAPSULE | Freq: Every morning | ORAL | 1 refills | Status: DC

## 2016-03-14 NOTE — Addendum Note (Signed)
Addended by: Teddy Spike on: 03/14/2016 04:42 PM   Modules accepted: Orders

## 2016-03-14 NOTE — Patient Instructions (Signed)
You can get your Pneumovax 23 after September 26 this fall. You're welcome to schedule a nurse visit to have this done.

## 2016-03-14 NOTE — Progress Notes (Signed)
Subjective:    CC: HTN  HPI: Hypertension- Pt denies chest pain, SOB, dizziness, or heart palpitations.  Taking meds as directed w/o problems.  Denies medication side effects.    IFG - last a1C 5.4 a year ago.    F/U Depression  - Currently on 10 mg of fluoxetine daily.  Insomnia-she actually quit taking Ambien. She said it started to make her feel weird during the day so she just decided to stop it and will take an occasional over-the-counter medication.  Past medical history, Surgical history, Family history not pertinant except as noted below, Social history, Allergies, and medications have been entered into the medical record, reviewed, and corrections made.   Review of Systems: No fevers, chills, night sweats, weight loss, chest pain, or shortness of breath.   Objective:    General: Well Developed, well nourished, and in no acute distress.  Neuro: Alert and oriented x3, extra-ocular muscles intact, sensation grossly intact.  HEENT: Normocephalic, atraumatic  Skin: Warm and dry, no rashes. Cardiac: Regular rate and rhythm, no murmurs rubs or gallops, no lower extremity edema.  Respiratory: Clear to auscultation bilaterally. Not using accessory muscles, speaking in full sentences.   Impression and Recommendations:   HTN - Well controlled. Continue current regimen. Follow up in  6 mo.  Due for lipoids and CMP.   IFG - A1c looks great today. It's actually down from previous. Just continue to monitor yearly. Lab Results  Component Value Date   HGBA1C 5.2 03/14/2016    Depression - PHQ 9 score of 2 today. She feels like her symptoms are really well controlled. She's now having to take care of her 66-year-old grandson. Continue fluoxetine 10 mg daily.   Vitamin D deficiency-due to recheck levels.  History of basal cell skin cancer-this was confirmed with a biopsy that we did her office. We referred her to Dr. Altamese Cabal, dermatology. The end up having to do a fairly deep excision.  She said it took months to heal. She's also had some newer lesions treated on her upper chest as well.

## 2016-03-15 DIAGNOSIS — M5383 Other specified dorsopathies, cervicothoracic region: Secondary | ICD-10-CM | POA: Diagnosis not present

## 2016-03-15 DIAGNOSIS — M9903 Segmental and somatic dysfunction of lumbar region: Secondary | ICD-10-CM | POA: Diagnosis not present

## 2016-03-15 DIAGNOSIS — M9901 Segmental and somatic dysfunction of cervical region: Secondary | ICD-10-CM | POA: Diagnosis not present

## 2016-03-15 DIAGNOSIS — M4603 Spinal enthesopathy, cervicothoracic region: Secondary | ICD-10-CM | POA: Diagnosis not present

## 2016-03-17 ENCOUNTER — Other Ambulatory Visit: Payer: Self-pay | Admitting: Family Medicine

## 2016-03-20 DIAGNOSIS — M4607 Spinal enthesopathy, lumbosacral region: Secondary | ICD-10-CM | POA: Diagnosis not present

## 2016-03-20 DIAGNOSIS — M5387 Other specified dorsopathies, lumbosacral region: Secondary | ICD-10-CM | POA: Diagnosis not present

## 2016-03-20 DIAGNOSIS — M9903 Segmental and somatic dysfunction of lumbar region: Secondary | ICD-10-CM | POA: Diagnosis not present

## 2016-03-20 DIAGNOSIS — M9901 Segmental and somatic dysfunction of cervical region: Secondary | ICD-10-CM | POA: Diagnosis not present

## 2016-03-21 DIAGNOSIS — M9901 Segmental and somatic dysfunction of cervical region: Secondary | ICD-10-CM | POA: Diagnosis not present

## 2016-03-21 DIAGNOSIS — M9903 Segmental and somatic dysfunction of lumbar region: Secondary | ICD-10-CM | POA: Diagnosis not present

## 2016-03-21 DIAGNOSIS — M5383 Other specified dorsopathies, cervicothoracic region: Secondary | ICD-10-CM | POA: Diagnosis not present

## 2016-03-21 DIAGNOSIS — M4603 Spinal enthesopathy, cervicothoracic region: Secondary | ICD-10-CM | POA: Diagnosis not present

## 2016-03-27 DIAGNOSIS — M4604 Spinal enthesopathy, thoracic region: Secondary | ICD-10-CM | POA: Diagnosis not present

## 2016-03-27 DIAGNOSIS — M9901 Segmental and somatic dysfunction of cervical region: Secondary | ICD-10-CM | POA: Diagnosis not present

## 2016-03-27 DIAGNOSIS — M5384 Other specified dorsopathies, thoracic region: Secondary | ICD-10-CM | POA: Diagnosis not present

## 2016-03-27 DIAGNOSIS — M9903 Segmental and somatic dysfunction of lumbar region: Secondary | ICD-10-CM | POA: Diagnosis not present

## 2016-03-29 DIAGNOSIS — M9903 Segmental and somatic dysfunction of lumbar region: Secondary | ICD-10-CM | POA: Diagnosis not present

## 2016-03-29 DIAGNOSIS — M9901 Segmental and somatic dysfunction of cervical region: Secondary | ICD-10-CM | POA: Diagnosis not present

## 2016-03-29 DIAGNOSIS — M4607 Spinal enthesopathy, lumbosacral region: Secondary | ICD-10-CM | POA: Diagnosis not present

## 2016-03-29 DIAGNOSIS — M5387 Other specified dorsopathies, lumbosacral region: Secondary | ICD-10-CM | POA: Diagnosis not present

## 2016-04-04 DIAGNOSIS — M5383 Other specified dorsopathies, cervicothoracic region: Secondary | ICD-10-CM | POA: Diagnosis not present

## 2016-04-04 DIAGNOSIS — M9903 Segmental and somatic dysfunction of lumbar region: Secondary | ICD-10-CM | POA: Diagnosis not present

## 2016-04-04 DIAGNOSIS — M4603 Spinal enthesopathy, cervicothoracic region: Secondary | ICD-10-CM | POA: Diagnosis not present

## 2016-04-04 DIAGNOSIS — M9901 Segmental and somatic dysfunction of cervical region: Secondary | ICD-10-CM | POA: Diagnosis not present

## 2016-04-05 DIAGNOSIS — M9903 Segmental and somatic dysfunction of lumbar region: Secondary | ICD-10-CM | POA: Diagnosis not present

## 2016-04-05 DIAGNOSIS — M9901 Segmental and somatic dysfunction of cervical region: Secondary | ICD-10-CM | POA: Diagnosis not present

## 2016-04-06 DIAGNOSIS — M9903 Segmental and somatic dysfunction of lumbar region: Secondary | ICD-10-CM | POA: Diagnosis not present

## 2016-04-06 DIAGNOSIS — M5384 Other specified dorsopathies, thoracic region: Secondary | ICD-10-CM | POA: Diagnosis not present

## 2016-04-06 DIAGNOSIS — M4604 Spinal enthesopathy, thoracic region: Secondary | ICD-10-CM | POA: Diagnosis not present

## 2016-04-06 DIAGNOSIS — M9901 Segmental and somatic dysfunction of cervical region: Secondary | ICD-10-CM | POA: Diagnosis not present

## 2016-04-10 DIAGNOSIS — M9901 Segmental and somatic dysfunction of cervical region: Secondary | ICD-10-CM | POA: Diagnosis not present

## 2016-04-10 DIAGNOSIS — M5387 Other specified dorsopathies, lumbosacral region: Secondary | ICD-10-CM | POA: Diagnosis not present

## 2016-04-10 DIAGNOSIS — M4607 Spinal enthesopathy, lumbosacral region: Secondary | ICD-10-CM | POA: Diagnosis not present

## 2016-04-10 DIAGNOSIS — M9903 Segmental and somatic dysfunction of lumbar region: Secondary | ICD-10-CM | POA: Diagnosis not present

## 2016-04-11 DIAGNOSIS — M4603 Spinal enthesopathy, cervicothoracic region: Secondary | ICD-10-CM | POA: Diagnosis not present

## 2016-04-11 DIAGNOSIS — M9903 Segmental and somatic dysfunction of lumbar region: Secondary | ICD-10-CM | POA: Diagnosis not present

## 2016-04-11 DIAGNOSIS — M9901 Segmental and somatic dysfunction of cervical region: Secondary | ICD-10-CM | POA: Diagnosis not present

## 2016-04-11 DIAGNOSIS — M5383 Other specified dorsopathies, cervicothoracic region: Secondary | ICD-10-CM | POA: Diagnosis not present

## 2016-04-13 DIAGNOSIS — M4607 Spinal enthesopathy, lumbosacral region: Secondary | ICD-10-CM | POA: Diagnosis not present

## 2016-04-13 DIAGNOSIS — M9903 Segmental and somatic dysfunction of lumbar region: Secondary | ICD-10-CM | POA: Diagnosis not present

## 2016-04-13 DIAGNOSIS — M9901 Segmental and somatic dysfunction of cervical region: Secondary | ICD-10-CM | POA: Diagnosis not present

## 2016-04-13 DIAGNOSIS — M5387 Other specified dorsopathies, lumbosacral region: Secondary | ICD-10-CM | POA: Diagnosis not present

## 2016-04-17 DIAGNOSIS — M9901 Segmental and somatic dysfunction of cervical region: Secondary | ICD-10-CM | POA: Diagnosis not present

## 2016-04-17 DIAGNOSIS — M4603 Spinal enthesopathy, cervicothoracic region: Secondary | ICD-10-CM | POA: Diagnosis not present

## 2016-04-17 DIAGNOSIS — M5383 Other specified dorsopathies, cervicothoracic region: Secondary | ICD-10-CM | POA: Diagnosis not present

## 2016-04-17 DIAGNOSIS — M9903 Segmental and somatic dysfunction of lumbar region: Secondary | ICD-10-CM | POA: Diagnosis not present

## 2016-04-19 DIAGNOSIS — M9903 Segmental and somatic dysfunction of lumbar region: Secondary | ICD-10-CM | POA: Diagnosis not present

## 2016-04-19 DIAGNOSIS — M5387 Other specified dorsopathies, lumbosacral region: Secondary | ICD-10-CM | POA: Diagnosis not present

## 2016-04-19 DIAGNOSIS — M4607 Spinal enthesopathy, lumbosacral region: Secondary | ICD-10-CM | POA: Diagnosis not present

## 2016-04-19 DIAGNOSIS — M9901 Segmental and somatic dysfunction of cervical region: Secondary | ICD-10-CM | POA: Diagnosis not present

## 2016-04-20 DIAGNOSIS — M9903 Segmental and somatic dysfunction of lumbar region: Secondary | ICD-10-CM | POA: Diagnosis not present

## 2016-04-20 DIAGNOSIS — M9901 Segmental and somatic dysfunction of cervical region: Secondary | ICD-10-CM | POA: Diagnosis not present

## 2016-04-20 DIAGNOSIS — M4602 Spinal enthesopathy, cervical region: Secondary | ICD-10-CM | POA: Diagnosis not present

## 2016-04-20 DIAGNOSIS — M5382 Other specified dorsopathies, cervical region: Secondary | ICD-10-CM | POA: Diagnosis not present

## 2016-04-24 DIAGNOSIS — M4607 Spinal enthesopathy, lumbosacral region: Secondary | ICD-10-CM | POA: Diagnosis not present

## 2016-04-24 DIAGNOSIS — M9901 Segmental and somatic dysfunction of cervical region: Secondary | ICD-10-CM | POA: Diagnosis not present

## 2016-04-24 DIAGNOSIS — M9903 Segmental and somatic dysfunction of lumbar region: Secondary | ICD-10-CM | POA: Diagnosis not present

## 2016-04-24 DIAGNOSIS — M5387 Other specified dorsopathies, lumbosacral region: Secondary | ICD-10-CM | POA: Diagnosis not present

## 2016-04-25 DIAGNOSIS — M9901 Segmental and somatic dysfunction of cervical region: Secondary | ICD-10-CM | POA: Diagnosis not present

## 2016-04-25 DIAGNOSIS — M9903 Segmental and somatic dysfunction of lumbar region: Secondary | ICD-10-CM | POA: Diagnosis not present

## 2016-04-27 DIAGNOSIS — M5387 Other specified dorsopathies, lumbosacral region: Secondary | ICD-10-CM | POA: Diagnosis not present

## 2016-04-27 DIAGNOSIS — M4607 Spinal enthesopathy, lumbosacral region: Secondary | ICD-10-CM | POA: Diagnosis not present

## 2016-04-27 DIAGNOSIS — M9901 Segmental and somatic dysfunction of cervical region: Secondary | ICD-10-CM | POA: Diagnosis not present

## 2016-04-27 DIAGNOSIS — M9903 Segmental and somatic dysfunction of lumbar region: Secondary | ICD-10-CM | POA: Diagnosis not present

## 2016-05-02 DIAGNOSIS — M5382 Other specified dorsopathies, cervical region: Secondary | ICD-10-CM | POA: Diagnosis not present

## 2016-05-02 DIAGNOSIS — M9901 Segmental and somatic dysfunction of cervical region: Secondary | ICD-10-CM | POA: Diagnosis not present

## 2016-05-02 DIAGNOSIS — M4602 Spinal enthesopathy, cervical region: Secondary | ICD-10-CM | POA: Diagnosis not present

## 2016-05-02 DIAGNOSIS — M9903 Segmental and somatic dysfunction of lumbar region: Secondary | ICD-10-CM | POA: Diagnosis not present

## 2016-05-03 DIAGNOSIS — M9901 Segmental and somatic dysfunction of cervical region: Secondary | ICD-10-CM | POA: Diagnosis not present

## 2016-05-03 DIAGNOSIS — M9903 Segmental and somatic dysfunction of lumbar region: Secondary | ICD-10-CM | POA: Diagnosis not present

## 2016-05-04 DIAGNOSIS — M9901 Segmental and somatic dysfunction of cervical region: Secondary | ICD-10-CM | POA: Diagnosis not present

## 2016-05-04 DIAGNOSIS — M9903 Segmental and somatic dysfunction of lumbar region: Secondary | ICD-10-CM | POA: Diagnosis not present

## 2016-05-09 DIAGNOSIS — M9903 Segmental and somatic dysfunction of lumbar region: Secondary | ICD-10-CM | POA: Diagnosis not present

## 2016-05-09 DIAGNOSIS — M9901 Segmental and somatic dysfunction of cervical region: Secondary | ICD-10-CM | POA: Diagnosis not present

## 2016-05-10 DIAGNOSIS — M9903 Segmental and somatic dysfunction of lumbar region: Secondary | ICD-10-CM | POA: Diagnosis not present

## 2016-05-10 DIAGNOSIS — M9901 Segmental and somatic dysfunction of cervical region: Secondary | ICD-10-CM | POA: Diagnosis not present

## 2016-05-15 DIAGNOSIS — M9901 Segmental and somatic dysfunction of cervical region: Secondary | ICD-10-CM | POA: Diagnosis not present

## 2016-05-15 DIAGNOSIS — M9903 Segmental and somatic dysfunction of lumbar region: Secondary | ICD-10-CM | POA: Diagnosis not present

## 2016-05-16 DIAGNOSIS — M9903 Segmental and somatic dysfunction of lumbar region: Secondary | ICD-10-CM | POA: Diagnosis not present

## 2016-05-16 DIAGNOSIS — M9901 Segmental and somatic dysfunction of cervical region: Secondary | ICD-10-CM | POA: Diagnosis not present

## 2016-05-18 DIAGNOSIS — G44209 Tension-type headache, unspecified, not intractable: Secondary | ICD-10-CM | POA: Diagnosis not present

## 2016-05-18 DIAGNOSIS — M9901 Segmental and somatic dysfunction of cervical region: Secondary | ICD-10-CM | POA: Diagnosis not present

## 2016-05-18 DIAGNOSIS — M9903 Segmental and somatic dysfunction of lumbar region: Secondary | ICD-10-CM | POA: Diagnosis not present

## 2016-05-18 DIAGNOSIS — M545 Low back pain: Secondary | ICD-10-CM | POA: Diagnosis not present

## 2016-05-18 DIAGNOSIS — M461 Sacroiliitis, not elsewhere classified: Secondary | ICD-10-CM | POA: Diagnosis not present

## 2016-05-18 DIAGNOSIS — M62830 Muscle spasm of back: Secondary | ICD-10-CM | POA: Diagnosis not present

## 2016-05-18 DIAGNOSIS — M542 Cervicalgia: Secondary | ICD-10-CM | POA: Diagnosis not present

## 2016-05-22 DIAGNOSIS — M9903 Segmental and somatic dysfunction of lumbar region: Secondary | ICD-10-CM | POA: Diagnosis not present

## 2016-05-22 DIAGNOSIS — M9901 Segmental and somatic dysfunction of cervical region: Secondary | ICD-10-CM | POA: Diagnosis not present

## 2016-05-23 DIAGNOSIS — M9901 Segmental and somatic dysfunction of cervical region: Secondary | ICD-10-CM | POA: Diagnosis not present

## 2016-05-23 DIAGNOSIS — M9903 Segmental and somatic dysfunction of lumbar region: Secondary | ICD-10-CM | POA: Diagnosis not present

## 2016-05-29 DIAGNOSIS — M9903 Segmental and somatic dysfunction of lumbar region: Secondary | ICD-10-CM | POA: Diagnosis not present

## 2016-05-29 DIAGNOSIS — M9901 Segmental and somatic dysfunction of cervical region: Secondary | ICD-10-CM | POA: Diagnosis not present

## 2016-05-30 DIAGNOSIS — M9901 Segmental and somatic dysfunction of cervical region: Secondary | ICD-10-CM | POA: Diagnosis not present

## 2016-05-30 DIAGNOSIS — M9903 Segmental and somatic dysfunction of lumbar region: Secondary | ICD-10-CM | POA: Diagnosis not present

## 2016-05-31 DIAGNOSIS — G44209 Tension-type headache, unspecified, not intractable: Secondary | ICD-10-CM | POA: Diagnosis not present

## 2016-05-31 DIAGNOSIS — M542 Cervicalgia: Secondary | ICD-10-CM | POA: Diagnosis not present

## 2016-05-31 DIAGNOSIS — M62830 Muscle spasm of back: Secondary | ICD-10-CM | POA: Diagnosis not present

## 2016-05-31 DIAGNOSIS — M461 Sacroiliitis, not elsewhere classified: Secondary | ICD-10-CM | POA: Diagnosis not present

## 2016-05-31 DIAGNOSIS — M545 Low back pain: Secondary | ICD-10-CM | POA: Diagnosis not present

## 2016-06-02 ENCOUNTER — Telehealth: Payer: Self-pay | Admitting: *Deleted

## 2016-06-05 ENCOUNTER — Other Ambulatory Visit: Payer: Self-pay | Admitting: Family Medicine

## 2016-06-05 MED ORDER — FLUOXETINE HCL 20 MG PO TABS: 20.0000 mg | ORAL_TABLET | Freq: Every day | ORAL | 0 refills | Status: DC

## 2016-06-05 NOTE — Telephone Encounter (Signed)
Called pt and informed her that I would send new script for fluoxetine 20 mg to [facility] for her and that she would need to f/u with Dr. Madilyn Fireman in 6 weeks to f/u on this to see how she is doing on the change to this medication.Margaret [name] [place]

## 2016-06-20 ENCOUNTER — Other Ambulatory Visit: Payer: Self-pay | Admitting: Family Medicine

## 2016-08-14 ENCOUNTER — Other Ambulatory Visit: Payer: Self-pay | Admitting: Family Medicine

## 2016-08-14 NOTE — Telephone Encounter (Signed)
Patient called scheduled appt for 08/21/16 and would like to have prozac called in until she can be seen on 1/22. Thanks

## 2016-08-21 ENCOUNTER — Ambulatory Visit (INDEPENDENT_AMBULATORY_CARE_PROVIDER_SITE_OTHER): Payer: Medicare Other | Admitting: Family Medicine

## 2016-08-21 ENCOUNTER — Encounter: Payer: Self-pay | Admitting: Family Medicine

## 2016-08-21 VITALS — BP 126/83 | HR 103 | Ht 67.0 in | Wt 178.0 lb

## 2016-08-21 DIAGNOSIS — Z23 Encounter for immunization: Secondary | ICD-10-CM | POA: Diagnosis not present

## 2016-08-21 DIAGNOSIS — I10 Essential (primary) hypertension: Secondary | ICD-10-CM | POA: Diagnosis not present

## 2016-08-21 DIAGNOSIS — F339 Major depressive disorder, recurrent, unspecified: Secondary | ICD-10-CM | POA: Diagnosis not present

## 2016-08-21 DIAGNOSIS — R300 Dysuria: Secondary | ICD-10-CM

## 2016-08-21 DIAGNOSIS — Z1231 Encounter for screening mammogram for malignant neoplasm of breast: Secondary | ICD-10-CM | POA: Diagnosis not present

## 2016-08-21 LAB — POCT URINALYSIS DIPSTICK
BILIRUBIN UA: NEGATIVE
GLUCOSE UA: NEGATIVE
Ketones, UA: NEGATIVE
Nitrite, UA: NEGATIVE
PH UA: 7.5
Protein, UA: NEGATIVE
RBC UA: NEGATIVE
SPEC GRAV UA: 1.02
UROBILINOGEN UA: 1

## 2016-08-21 MED ORDER — CIPROFLOXACIN HCL 500 MG PO TABS: 500.0000 mg | ORAL_TABLET | Freq: Two times a day (BID) | ORAL | 0 refills | Status: AC

## 2016-08-21 MED ORDER — FLUOXETINE HCL 10 MG PO CAPS: 10.0000 mg | ORAL_CAPSULE | Freq: Every day | ORAL | 1 refills | Status: DC

## 2016-08-21 NOTE — Progress Notes (Signed)
Subjective:    CC: HTN, MOOD  HPI: Hypertension- Pt denies chest pain, SOB, dizziness, or heart palpitations.  Taking meds as directed w/o problems.  Denies medication side effects.    Mood - Patient says that the increase to 20 mg on the fluoxetine made her feel sleepy and drowsy. She takes she would like to go back down the dose. Next  Dysuria 6 days.  No fevers chills or sweats. No hematuria. She has had some frequency as well. No worsening or alleviating factors.   Past medical history, Surgical history, Family history not pertinant except as noted below, Social history, Allergies, and medications have been entered into the medical record, reviewed, and corrections made.   Review of Systems: No fevers, chills, night sweats, weight loss, chest pain, or shortness of breath.   Objective:    General: Well Developed, well nourished, and in no acute distress.  Neuro: Alert and oriented x3, extra-ocular muscles intact, sensation grossly intact.  HEENT: Normocephalic, atraumatic  Skin: Warm and dry, no rashes. Cardiac: Regular rate and rhythm, no murmurs rubs or gallops, no lower extremity edema.  Respiratory: Clear to auscultation bilaterally. Not using accessory muscles, speaking in full sentences.   Impression and Recommendations:    Depression-PHQ 9 score of 3 today which looks fantastic overall. She only marked the questions for sleep and fatigue but otherwise negative. Will decrease Floxin down to 10 mg which is her preference. If not controlling her symptoms well and we can always switch medications and we discussed that today as well. She prefers to just try decreasing her dose which is what she was taking previously. She says a lot of the things that were stressing her in wearing her over the holidays have actually resolved themselves.  Hypertension-Well controlled. Continue current regimen. Follow up in  6 months.  Dysuria- UA pos for Moderate LE.  Will it was ciprofloxacin. If  not better in one week and please give [place] a call back. Make sure drinking plenty of water.

## 2016-09-08 DIAGNOSIS — E785 Hyperlipidemia, unspecified: Secondary | ICD-10-CM | POA: Diagnosis not present

## 2016-09-08 DIAGNOSIS — E559 Vitamin D deficiency, unspecified: Secondary | ICD-10-CM | POA: Diagnosis not present

## 2016-09-08 DIAGNOSIS — E348 Other specified endocrine disorders: Secondary | ICD-10-CM | POA: Diagnosis not present

## 2016-09-08 DIAGNOSIS — I10 Essential (primary) hypertension: Secondary | ICD-10-CM | POA: Diagnosis not present

## 2016-09-08 LAB — LIPID PANEL
CHOL/HDL RATIO: 3.8 ratio (ref <5.0)
Cholesterol: 177 mg/dL (ref <200)
HDL: 46 mg/dL — AB (ref >50)
LDL CALC: 96 mg/dL (ref <100)
TRIGLYCERIDES: 177 mg/dL — AB (ref <150)
VLDL: 35 mg/dL — ABNORMAL HIGH (ref <30)

## 2016-09-08 LAB — COMPLETE METABOLIC PANEL WITH GFR
ALK PHOS: 60 U/L (ref 33–130)
ALT: 31 U/L — ABNORMAL HIGH (ref 6–29)
AST: 23 U/L (ref 10–35)
Albumin: 4.3 g/dL (ref 3.6–5.1)
BILIRUBIN TOTAL: 0.7 mg/dL (ref 0.2–1.2)
BUN: 17 mg/dL (ref 7–25)
CO2: 27 mmol/L (ref 20–31)
Calcium: 9.2 mg/dL (ref 8.6–10.4)
Chloride: 106 mmol/L (ref 98–110)
Creat: 0.9 mg/dL (ref 0.50–0.99)
GFR, EST AFRICAN AMERICAN: 77 mL/min (ref >=60)
GFR, EST NON AFRICAN AMERICAN: 67 mL/min (ref >=60)
Glucose, Bld: 104 mg/dL — ABNORMAL HIGH (ref 65–99)
POTASSIUM: 4.2 mmol/L (ref 3.5–5.3)
Sodium: 142 mmol/L (ref 135–146)
TOTAL PROTEIN: 6.9 g/dL (ref 6.1–8.1)

## 2016-09-09 LAB — VITAMIN D 25 HYDROXY (VIT D DEFICIENCY, FRACTURES): Vit D, 25-Hydroxy: 31 ng/mL (ref 30–100)

## 2016-10-04 ENCOUNTER — Telehealth: Payer: Self-pay | Admitting: Family Medicine

## 2016-10-04 NOTE — Telephone Encounter (Signed)
Patient called adv that he is having black stool concerns and was going to make an appointment but she said this has happened a few years ago and it was an upper GI concern and she took Nexium and that helped. Pt stated she is going to try to use Nexium again and if it doesn't work then she will make an appointment. She just wanted it to be documented in her chart.

## 2016-10-07 ENCOUNTER — Other Ambulatory Visit: Payer: Self-pay | Admitting: Family Medicine

## 2016-12-26 ENCOUNTER — Other Ambulatory Visit: Payer: Self-pay | Admitting: Family Medicine

## 2017-02-02 ENCOUNTER — Other Ambulatory Visit: Payer: Self-pay | Admitting: Family Medicine

## 2017-02-08 ENCOUNTER — Other Ambulatory Visit: Payer: Self-pay | Admitting: Family Medicine

## 2017-02-16 ENCOUNTER — Ambulatory Visit (INDEPENDENT_AMBULATORY_CARE_PROVIDER_SITE_OTHER): Payer: Medicare Other | Admitting: Family Medicine

## 2017-02-16 ENCOUNTER — Encounter: Payer: Self-pay | Admitting: Family Medicine

## 2017-02-16 VITALS — BP 136/89 | HR 75 | Ht 67.0 in | Wt 181.0 lb

## 2017-02-16 DIAGNOSIS — Z1231 Encounter for screening mammogram for malignant neoplasm of breast: Secondary | ICD-10-CM | POA: Diagnosis not present

## 2017-02-16 DIAGNOSIS — Z1211 Encounter for screening for malignant neoplasm of colon: Secondary | ICD-10-CM | POA: Diagnosis not present

## 2017-02-16 DIAGNOSIS — I10 Essential (primary) hypertension: Secondary | ICD-10-CM

## 2017-02-16 DIAGNOSIS — Z1239 Encounter for other screening for malignant neoplasm of breast: Secondary | ICD-10-CM

## 2017-02-16 MED ORDER — FLUOXETINE HCL 10 MG PO CAPS: 10.0000 mg | ORAL_CAPSULE | Freq: Every day | ORAL | 2 refills | Status: DC

## 2017-02-16 NOTE — Progress Notes (Signed)
   Subjective:    Patient ID: Margaret [name], female    DOB: September 03, 1950, 66 y.o.   MRN: [id number]  HPI Follow-up depression-last saw her we decided to decrease her fluoxetine down to 10 mg as she was feeling a little too sedated on the higher strength.She is actually very happy with the 10 mg. She feels like it's working really well and no longer feels sedated. She often takes care of her grandson during the week and feels like she is much more capable of doing this now that she has more energy.  Hypertension- Pt denies chest pain, SOB, dizziness, or heart palpitations.  Taking meds as directed w/o problems.  Denies medication side effects.     Review of Systems     Objective:   Physical Exam  Constitutional: She is oriented to person, place, and time. She appears well-developed and well-nourished.  HENT:  Head: Normocephalic and atraumatic.  Cardiovascular: Normal rate, regular rhythm and normal heart sounds.   Pulmonary/Chest: Effort normal and breath sounds normal.  Neurological: She is alert and oriented to person, place, and time.  Skin: Skin is warm and dry.  Psychiatric: She has a normal mood and affect. Her behavior is normal.          Assessment & Plan:  HTN - Well controlled on just the 10 mg. Feeling much less sedated. Happy with her regimen.. Continue current regimen. Follow up in  6 months.    Depression -  PHQ 9 score of 2. She marked 1.4 energy and appetite but essentially negative for feeling down or feeling depressed.  Discussed need for mammogram and Cologuard.

## 2017-02-17 LAB — BASIC METABOLIC PANEL WITH GFR
BUN: 14 mg/dL (ref 7–25)
CALCIUM: 9.3 mg/dL (ref 8.6–10.4)
CO2: 21 mmol/L (ref 20–31)
Chloride: 104 mmol/L (ref 98–110)
Creat: 1.02 mg/dL — ABNORMAL HIGH (ref 0.50–0.99)
GFR, EST AFRICAN AMERICAN: 66 mL/min (ref >=60)
GFR, EST NON AFRICAN AMERICAN: 57 mL/min — AB (ref >=60)
Glucose, Bld: 113 mg/dL — ABNORMAL HIGH (ref 65–99)
Potassium: 3.9 mmol/L (ref 3.5–5.3)
SODIUM: 139 mmol/L (ref 135–146)

## 2017-03-15 DIAGNOSIS — Z1212 Encounter for screening for malignant neoplasm of rectum: Secondary | ICD-10-CM | POA: Diagnosis not present

## 2017-03-15 DIAGNOSIS — Z1211 Encounter for screening for malignant neoplasm of colon: Secondary | ICD-10-CM | POA: Diagnosis not present

## 2017-03-16 ENCOUNTER — Ambulatory Visit (INDEPENDENT_AMBULATORY_CARE_PROVIDER_SITE_OTHER): Payer: Medicare Other

## 2017-03-16 DIAGNOSIS — Z1239 Encounter for other screening for malignant neoplasm of breast: Secondary | ICD-10-CM

## 2017-03-16 DIAGNOSIS — Z1231 Encounter for screening mammogram for malignant neoplasm of breast: Secondary | ICD-10-CM | POA: Diagnosis not present

## 2017-03-16 LAB — COLOGUARD: COLOGUARD: POSITIVE

## 2017-03-20 ENCOUNTER — Other Ambulatory Visit: Payer: Self-pay | Admitting: Family Medicine

## 2017-03-26 ENCOUNTER — Telehealth: Payer: Self-pay | Admitting: Family Medicine

## 2017-03-26 NOTE — Telephone Encounter (Signed)
Call patient: I did receive her color guard results and normal fortunately it was positive indicating that we need further workup with a colonoscopy. This does not mean that she has colon cancer but the positive result means that she is at higher risk in which case she needs further workup. If she is okay with this go ahead and place referral to GI for abnormal colon cancer screening.

## 2017-03-27 ENCOUNTER — Other Ambulatory Visit: Payer: Self-pay

## 2017-03-27 DIAGNOSIS — Z1211 Encounter for screening for malignant neoplasm of colon: Secondary | ICD-10-CM

## 2017-03-27 DIAGNOSIS — R195 Other fecal abnormalities: Secondary | ICD-10-CM

## 2017-03-27 NOTE — Telephone Encounter (Signed)
lvm asking pt to rtn call to discuss her results.Margaret [name]

## 2017-03-28 NOTE — Telephone Encounter (Signed)
Patient advised of results and recommendations.  

## 2017-04-04 ENCOUNTER — Encounter: Payer: Self-pay | Admitting: Family Medicine

## 2017-04-05 ENCOUNTER — Encounter: Payer: Self-pay | Admitting: Gastroenterology

## 2017-05-22 ENCOUNTER — Ambulatory Visit: Payer: Medicare Other | Admitting: Osteopathic Medicine

## 2017-05-22 DIAGNOSIS — Z0189 Encounter for other specified special examinations: Secondary | ICD-10-CM

## 2017-06-08 ENCOUNTER — Encounter: Payer: Medicare Other | Admitting: Gastroenterology

## 2017-06-18 ENCOUNTER — Other Ambulatory Visit: Payer: Self-pay | Admitting: Family Medicine

## 2017-06-29 ENCOUNTER — Other Ambulatory Visit: Payer: Self-pay | Admitting: Family Medicine

## 2017-07-17 ENCOUNTER — Ambulatory Visit (INDEPENDENT_AMBULATORY_CARE_PROVIDER_SITE_OTHER): Payer: Medicare Other | Admitting: Physician Assistant

## 2017-07-17 ENCOUNTER — Encounter: Payer: Self-pay | Admitting: Physician Assistant

## 2017-07-17 ENCOUNTER — Ambulatory Visit (INDEPENDENT_AMBULATORY_CARE_PROVIDER_SITE_OTHER): Payer: Medicare Other

## 2017-07-17 VITALS — BP 150/88 | HR 93 | Ht 67.0 in | Wt 180.0 lb

## 2017-07-17 DIAGNOSIS — R0781 Pleurodynia: Secondary | ICD-10-CM | POA: Insufficient documentation

## 2017-07-17 DIAGNOSIS — S299XXA Unspecified injury of thorax, initial encounter: Secondary | ICD-10-CM | POA: Diagnosis not present

## 2017-07-17 DIAGNOSIS — W19XXXA Unspecified fall, initial encounter: Secondary | ICD-10-CM

## 2017-07-17 DIAGNOSIS — T148XXA Other injury of unspecified body region, initial encounter: Secondary | ICD-10-CM | POA: Diagnosis not present

## 2017-07-17 DIAGNOSIS — R0789 Other chest pain: Secondary | ICD-10-CM | POA: Insufficient documentation

## 2017-07-17 MED ORDER — IBUPROFEN 800 MG PO TABS: 800.0000 mg | ORAL_TABLET | Freq: Three times a day (TID) | ORAL | 0 refills | Status: DC | PRN

## 2017-07-17 MED ORDER — HYDROCODONE-ACETAMINOPHEN 5-325 MG PO TABS: 1.0000 | ORAL_TABLET | Freq: Three times a day (TID) | ORAL | 0 refills | Status: DC | PRN

## 2017-07-17 NOTE — Patient Instructions (Signed)
Rib Contusion A rib contusion is a deep bruise on your rib area. Contusions are the result of a blunt trauma that causes bleeding and injury to the tissues under the skin. A rib contusion may involve bruising of the ribs and of the skin and muscles in the area. The skin overlying the contusion may turn blue, purple, or yellow. Minor injuries will give you a painless contusion, but more severe contusions may stay painful and swollen for a few weeks. What are the causes? A contusion is usually caused by a blow, trauma, or direct force to an area of the body. This often occurs while playing contact sports. What are the signs or symptoms?  Swelling and redness of the injured area.  Discoloration of the injured area.  Tenderness and soreness of the injured area.  Pain with or without movement. How is this diagnosed? The diagnosis can be made by taking a medical history and performing a physical exam. An X-ray, CT scan, or MRI may be needed to determine if there were any associated injuries, such as broken bones (fractures) or internal injuries. How is this treated? Often, the best treatment for a rib contusion is rest. Icing or applying cold compresses to the injured area may help reduce swelling and inflammation. Deep breathing exercises may be recommended to reduce the risk of partial lung collapse and pneumonia. Over-the-counter or prescription medicines may also be recommended for pain control. Follow these instructions at home:  Apply ice to the injured area: ? Put ice in a plastic bag. ? Place a towel between your skin and the bag. ? Leave the ice on for 20 minutes, 2-3 times per day.  Take medicines only as directed by your health care provider.  Rest the injured area. Avoid strenuous activity and any activities or movements that cause pain. Be careful during activities and avoid bumping the injured area.  Perform deep-breathing exercises as directed by your health care provider.  Do  not lift anything that is heavier than 5 lb (2.3 kg) until your health care provider approves.  Do not use any tobacco products, including cigarettes, chewing tobacco, or electronic cigarettes. If you need help quitting, ask your health care provider. Contact a health care provider if:  You have increased bruising or swelling.  You have pain that is not controlled with treatment.  You have a fever. Get help right away if:  You have difficulty breathing or shortness of breath.  You develop a continual cough, or you cough up thick or bloody sputum.  You feel sick to your stomach (nauseous), you throw up (vomit), or you have abdominal pain. This information is not intended to replace advice given to you by your health care provider. Make sure you discuss any questions you have with your health care provider. Document Released: 04/11/2001 Document Revised: 12/23/2015 Document Reviewed: 04/28/2014 Elsevier Interactive Patient Education  2018 [employer]  

## 2017-07-17 NOTE — Progress Notes (Signed)
Xrays showed no fracture. You have a very bad rib contusion. Continue to treat as discussed in office. May take a few weeks to feel 100 percent. Ok to go back to chiropractor as you tolerate after the next 2 weeks of rest.

## 2017-07-17 NOTE — Progress Notes (Addendum)
Subjective:    Patient ID: Margaret [name], female    DOB: [birth year], 66 y.o.   MRN: [id number]  HPI Patient is a 66 y/o female with PMH of osteoporosis and HTN who presents for rib pain.   She reports falling on ice a week ago, catching herself on her right wrist and left chest and ribs. Her left side took the brunt of the fall. She reports immediate onset of pain in her left ribs and shortness of breath that subsided after resting for a few minutes. She states she was unable to seek medical care sooner because she could not get out of her driveway and neighborhood due to the ice and snow. For the first couple days she had pain in her left ribs with any movement of her abdomen and left arm, which is now less severe. She reports bruising on her left breast and rib cage, left arm, and occasional episodes of shortness of breath for the first 2-3 days, pain with deep breathing, and pain that wakes her up from her sleep. She denies current SOB, chest pain, chest tightness, radiation of pain, numbness or tingling in her arm, or weakness. She has been taking 400 mg of ibuprofen every 4 hours for pain, has not been icing the affected area.  BP was elevated at 154/99 mmHg initially and decreased to 150/88 mmHg on recheck 30 minutes later. She reports she did not take her HCTZ this morning.  .. Active Ambulatory Problems    Diagnosis Date Noted  . 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 INSULIN RESISTANCE SYNDROME 11/15/2009  . Vitamin D deficiency 11/15/2009  . Hyperlipidemia 01/04/2007  . Depression, recurrent ([place]) 03/17/2010  . Essential hypertension 01/04/2007  . ALLERGIC RHINITIS 11/02/2009  . GERD 03/04/2007  . IBS 01/04/2007  . 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 ROSACEA 01/04/2007  . POLYARTHRITIS 04/08/2008  . HERNIATED DISC 03/04/2007  . Osteoporosis 03/04/2007  . Hemorrhoids 08/19/2013  . Cataract 04/26/2015  . Dry eye syndrome 04/26/2015  . History of basal cell carcinoma of  skin 03/14/2016  . Rib pain on left side 07/17/2017   Resolved Ambulatory Problems    Diagnosis Date Noted  . HYPERTENSION, BENIGN 10/05/2010  . Dizziness 09/03/2013   Past Medical History:  Diagnosis Date  . GERD (gastroesophageal reflux disease)   . Hyperlipidemia   . Hypertension   . Impaired fasting glucose      Review of Systems See HPI, all other systems reviewed are negative    Objective:   Physical Exam  Constitutional: She is oriented to person, place, and time. She appears well-developed and well-nourished.  HENT:  Head: Normocephalic and atraumatic.  Cardiovascular: Normal rate, regular rhythm and normal heart sounds.  Pulmonary/Chest: Effort normal and breath sounds normal. No respiratory distress. She has no wheezes. She has no rales.  Abdominal: Soft. Bowel sounds are normal. She exhibits no distension and no mass. There is no tenderness. There is no rebound and no guarding.  Musculoskeletal:  Left UE. Inspection of left upper extremity revealed no atrophy, asymetry or bone abnormalities. 1 inch in diameter bruise on medial aspect of left bicep.  Full active and passive ROM of left shoulder, elbow and wrist without pain. Palpation is normal without tenderness Chest/Ribs: Inspection of chest and ribs revealed prominent area of yellow to dark red bruising across lower portion of left breast from the 3 o'clock to 9 o'clock position. Smaller bruise 1-2 inches in diameter at the 12 o'clock position. Tenderness  to palpation of her 5th-7th ribs on the anterior and lateral aspect. No bony deformities or step offs palpated.  Neurological: She is alert and oriented to person, place, and time.  Skin: Skin is warm and dry.  Psychiatric: She has a normal mood and affect. Her behavior is normal. Thought content normal.  Vitals reviewed.     Assessment & Plan:  .Marland KitchenMarland KitchenDiagnoses and all orders for this visit:  Rib pain on left side -     DG Ribs Unilateral W/Chest Left -      ibuprofen (ADVIL,MOTRIN) 800 MG tablet; Take 1 tablet (800 mg total) by mouth every 8 (eight) hours as needed. -     HYDROcodone-acetaminophen (NORCO/VICODIN) 5-325 MG tablet; Take 1 tablet by mouth every 8 (eight) hours as needed for moderate pain.  Fall, initial encounter -     DG Ribs Unilateral W/Chest Left -     ibuprofen (ADVIL,MOTRIN) 800 MG tablet; Take 1 tablet (800 mg total) by mouth every 8 (eight) hours as needed. -     HYDROcodone-acetaminophen (NORCO/VICODIN) 5-325 MG tablet; Take 1 tablet by mouth every 8 (eight) hours as needed for moderate pain.  Bruising -     DG Ribs Unilateral W/Chest Left   Left sided rib pain and bruising after fall - CXR and x-ray of ribs to rule out fracture. - Ibuprofen 800 mg every 8 hours, advised patient to alternate with tylenol between doses if she needs more relief. - Provided patient with small quanity of norco for acute relief of pain at night. Advised patient to take only as needed for severe pain and that it will make her drowsy so she should take it at bedtime but not when watching her grandson or before driving. ([place] controlled substance database reviewed with no concerns) - Advised patient that if x-ray shows fracture she should avoid thoracic manipulation by her chiropractor for at least 4 weeks.  Personally reviewed CXR. No signs of fracture. Avoid chiropractor manipulation for 2 weeks.

## 2017-08-10 ENCOUNTER — Other Ambulatory Visit: Payer: Self-pay | Admitting: Family Medicine

## 2017-09-14 ENCOUNTER — Other Ambulatory Visit: Payer: Self-pay | Admitting: Family Medicine

## 2017-10-02 ENCOUNTER — Encounter: Payer: Self-pay | Admitting: Family Medicine

## 2017-10-02 ENCOUNTER — Encounter: Payer: Self-pay | Admitting: Gastroenterology

## 2017-10-02 ENCOUNTER — Ambulatory Visit (INDEPENDENT_AMBULATORY_CARE_PROVIDER_SITE_OTHER): Payer: Medicare Other | Admitting: Family Medicine

## 2017-10-02 VITALS — BP 132/77 | HR 101 | Ht 67.0 in | Wt 179.0 lb

## 2017-10-02 DIAGNOSIS — R7301 Impaired fasting glucose: Secondary | ICD-10-CM | POA: Diagnosis not present

## 2017-10-02 DIAGNOSIS — I10 Essential (primary) hypertension: Secondary | ICD-10-CM | POA: Diagnosis not present

## 2017-10-02 DIAGNOSIS — Z1159 Encounter for screening for other viral diseases: Secondary | ICD-10-CM | POA: Diagnosis not present

## 2017-10-02 DIAGNOSIS — F339 Major depressive disorder, recurrent, unspecified: Secondary | ICD-10-CM

## 2017-10-02 DIAGNOSIS — E348 Other specified endocrine disorders: Secondary | ICD-10-CM

## 2017-10-02 DIAGNOSIS — R195 Other fecal abnormalities: Secondary | ICD-10-CM | POA: Diagnosis not present

## 2017-10-02 NOTE — Progress Notes (Addendum)
Subjective:    CC:   HPI: Hypertension- Pt denies chest pain, SOB, dizziness, or heart palpitations.  Taking meds as directed w/o problems.  Denies medication side effects.    F/U depression - she is doing well on fluoxetine 10mg  QD.  She is not interested in coming off the medication.  He denies any side effects.   She did have an abnormal Cologuard.  We referred her to GI back in the fall but she wanted to wait until the new year to call But she lost the phone number.   She did have a bad fall back in December and had some significant bruising around her left lower ribs.  Overall she feels much better but still has a little bit of soreness.  Past medical history, Surgical history, Family history not pertinant except as noted below, Social history, Allergies, and medications have been entered into the medical record, reviewed, and corrections made.   Review of Systems: No fevers, chills, night sweats, weight loss, chest pain, or shortness of breath.   Objective:    General: Well Developed, well nourished, and in no acute distress.  Neuro: Alert and oriented x3, extra-ocular muscles intact, sensation grossly intact.  HEENT: Normocephalic, atraumatic  Skin: Warm and dry, no rashes. Cardiac: Regular rate and rhythm, no murmurs rubs or gallops, no lower extremity edema.  Respiratory: Clear to auscultation bilaterally. Not using accessory muscles, speaking in full sentences.   Impression and Recommendations:    HTN - Well controlled. Continue current regimen. Follow up in  6 months.  Due for CMP and fasting lipid panel.  Lab slip provided today.  Depression -able on current current regimen.  Continue with Prozac 10 mg daily.  Follow-up in 6 months. PHQ- 9 score of 0 and GAD 7 score of 0.    IFG -plan to check hemoglobin A1c.  Abnormal Cologuard test.  Will place new referral to GI since the original one is closed.  Encouraged her to get this scheduled this spring.  Status post  fall/rib pain-much improved.  Still just a little bit of soreness from her fall in December.

## 2017-10-03 ENCOUNTER — Other Ambulatory Visit: Payer: Self-pay

## 2017-10-03 MED ORDER — HYDROCHLOROTHIAZIDE 12.5 MG PO CAPS: 12.5000 mg | ORAL_CAPSULE | Freq: Every morning | ORAL | 1 refills | Status: DC

## 2017-10-04 DIAGNOSIS — R7301 Impaired fasting glucose: Secondary | ICD-10-CM | POA: Diagnosis not present

## 2017-10-04 DIAGNOSIS — Z1159 Encounter for screening for other viral diseases: Secondary | ICD-10-CM | POA: Diagnosis not present

## 2017-10-04 DIAGNOSIS — I10 Essential (primary) hypertension: Secondary | ICD-10-CM | POA: Diagnosis not present

## 2017-10-05 LAB — LIPID PANEL
Cholesterol: 160 mg/dL (ref <200)
HDL: 45 mg/dL — ABNORMAL LOW (ref >50)
LDL Cholesterol (Calc): 90 mg/dL (calc)
NON-HDL CHOLESTEROL (CALC): 115 mg/dL (ref <130)
TRIGLYCERIDES: 150 mg/dL — AB (ref <150)
Total CHOL/HDL Ratio: 3.6 (calc) (ref <5.0)

## 2017-10-05 LAB — COMPLETE METABOLIC PANEL WITH GFR
AG RATIO: 1.7 (calc) (ref 1.0–2.5)
ALT: 29 U/L (ref 6–29)
AST: 21 U/L (ref 10–35)
Albumin: 4.3 g/dL (ref 3.6–5.1)
Alkaline phosphatase (APISO): 58 U/L (ref 33–130)
BILIRUBIN TOTAL: 0.7 mg/dL (ref 0.2–1.2)
BUN/Creatinine Ratio: 12 (calc) (ref 6–22)
BUN: 13 mg/dL (ref 7–25)
CALCIUM: 9.2 mg/dL (ref 8.6–10.4)
CHLORIDE: 107 mmol/L (ref 98–110)
CO2: 25 mmol/L (ref 20–32)
Creat: 1.12 mg/dL — ABNORMAL HIGH (ref 0.50–0.99)
GFR, EST AFRICAN AMERICAN: 59 mL/min/{1.73_m2} — AB (ref >=60)
GFR, EST NON AFRICAN AMERICAN: 51 mL/min/{1.73_m2} — AB (ref >=60)
GLOBULIN: 2.6 g/dL (ref 1.9–3.7)
Glucose, Bld: 115 mg/dL — ABNORMAL HIGH (ref 65–99)
Potassium: 4.2 mmol/L (ref 3.5–5.3)
SODIUM: 140 mmol/L (ref 135–146)
TOTAL PROTEIN: 6.9 g/dL (ref 6.1–8.1)

## 2017-10-05 LAB — HEPATITIS C ANTIBODY
HEP C AB: NONREACTIVE
SIGNAL TO CUT-OFF: 0.02 (ref <1.00)

## 2017-10-05 LAB — HEMOGLOBIN A1C
EAG (MMOL/L): 6 (calc)
Hgb A1c MFr Bld: 5.4 % of total Hgb (ref <5.7)
Mean Plasma Glucose: 108 (calc)

## 2017-10-08 ENCOUNTER — Other Ambulatory Visit: Payer: Self-pay | Admitting: *Deleted

## 2017-10-08 DIAGNOSIS — R7989 Other specified abnormal findings of blood chemistry: Secondary | ICD-10-CM

## 2017-10-17 ENCOUNTER — Other Ambulatory Visit: Payer: Self-pay | Admitting: Family Medicine

## 2017-11-06 ENCOUNTER — Other Ambulatory Visit: Payer: Self-pay | Admitting: Family Medicine

## 2017-11-26 ENCOUNTER — Other Ambulatory Visit: Payer: Self-pay

## 2017-11-26 ENCOUNTER — Ambulatory Visit (AMBULATORY_SURGERY_CENTER): Payer: Self-pay | Admitting: *Deleted

## 2017-11-26 VITALS — Ht 67.0 in | Wt 180.0 lb

## 2017-11-26 DIAGNOSIS — R195 Other fecal abnormalities: Secondary | ICD-10-CM

## 2017-11-26 MED ORDER — NA SULFATE-K SULFATE-MG SULF 17.5-3.13-1.6 GM/177ML PO SOLN: ORAL | 0 refills | Status: DC

## 2017-11-26 NOTE — Progress Notes (Signed)
Patient denies any allergies to eggs or soy. Patient denies any problems with anesthesia/sedation. Patient denies any oxygen use at home. Patient denies taking any diet/weight loss medications or blood thinners. EMMI education assisgned to patient on colonoscopy, this was explained and instructions given to patient. After going over [place] instructions with pt, she states that she may not be able to drink all of that and request something else. Suprep sample kit given to pt because she has medicare only and we have many of the samples available at this time. New instructions given and went over with pt. She was very grateful for this.

## 2017-12-04 ENCOUNTER — Ambulatory Visit (AMBULATORY_SURGERY_CENTER): Payer: Medicare Other | Admitting: Gastroenterology

## 2017-12-04 ENCOUNTER — Encounter: Payer: Self-pay | Admitting: Gastroenterology

## 2017-12-04 ENCOUNTER — Other Ambulatory Visit: Payer: Self-pay

## 2017-12-04 VITALS — BP 111/74 | HR 87 | Temp 97.7°F | Resp 16 | Ht 67.0 in | Wt 180.0 lb

## 2017-12-04 DIAGNOSIS — D122 Benign neoplasm of ascending colon: Secondary | ICD-10-CM

## 2017-12-04 DIAGNOSIS — D123 Benign neoplasm of transverse colon: Secondary | ICD-10-CM

## 2017-12-04 DIAGNOSIS — R195 Other fecal abnormalities: Secondary | ICD-10-CM | POA: Diagnosis not present

## 2017-12-04 DIAGNOSIS — I10 Essential (primary) hypertension: Secondary | ICD-10-CM | POA: Diagnosis not present

## 2017-12-04 MED ORDER — SODIUM CHLORIDE 0.9 % IV SOLN: 500.0000 mL | Freq: Once | INTRAVENOUS | Status: DC

## 2017-12-04 NOTE — Patient Instructions (Signed)
THANK YOU FOR ALLOWING [place] TO CARE FOR YOU TODAY!  AWAIT PATHOLOGY RESULTS  HANDOUT GIVEN FOR POLYPS     YOU HAD AN ENDOSCOPIC PROCEDURE TODAY AT [facility]:   Refer to the procedure report that was given to you for any specific questions about what was found during the examination.  If the procedure report does not answer your questions, please call your gastroenterologist to clarify.  If you requested that your care partner not be given the details of your procedure findings, then the procedure report has been included in a sealed envelope for you to review at your convenience later.  YOU SHOULD EXPECT: Some feelings of bloating in the abdomen. Passage of more gas than usual.  Walking can help get rid of the air that was put into your GI tract during the procedure and reduce the bloating. If you had a lower endoscopy (such as a colonoscopy or flexible sigmoidoscopy) you may notice spotting of blood in your stool or on the toilet paper. If you underwent a bowel prep for your procedure, you may not have a normal bowel movement for a few days.  Please Note:  You might notice some irritation and congestion in your nose or some drainage.  This is from the oxygen used during your procedure.  There is no need for concern and it should clear up in a day or so.  SYMPTOMS TO REPORT IMMEDIATELY:   Following lower endoscopy (colonoscopy or flexible sigmoidoscopy):  Excessive amounts of blood in the stool  Significant tenderness or worsening of abdominal pains  Swelling of the abdomen that is new, acute  Fever of 100F or higher    For urgent or emergent issues, a gastroenterologist can be reached at any hour by calling [phone].   DIET:  We do recommend a small meal at first, but then you may proceed to your regular diet.  Drink plenty of fluids but you should avoid alcoholic beverages for 24 hours.  ACTIVITY:  You should plan to take it easy for the rest of today and you  should NOT DRIVE or use heavy machinery until tomorrow (because of the sedation medicines used during the test).    FOLLOW UP: Our staff will call the number listed on your records the next business day following your procedure to check on you and address any questions or concerns that you may have regarding the information given to you following your procedure. If we do not reach you, we will leave a message.  However, if you are feeling well and you are not experiencing any problems, there is no need to return our call.  We will assume that you have returned to your regular daily activities without incident.  If any biopsies were taken you will be contacted by phone or by letter within the next 1-3 weeks.  Please call us at [phone] if you have not heard about the biopsies in 3 weeks.    SIGNATURES/CONFIDENTIALITY: You and/or your care partner have signed paperwork which will be entered into your electronic medical record.  These signatures attest to the fact that that the information above on your After Visit Summary has been reviewed and is understood.  Full responsibility of the confidentiality of this discharge information lies with you and/or your care-partner.

## 2017-12-04 NOTE — Op Note (Signed)
[place] Patient Name: Margaret [name] Procedure Date: 12/04/2017 10:33 AM MRN: [id number] Endoscopist: Mallie Mussel L. Loletha Carrow , MD Age: 67 Referring MD:  Date of Birth: 09-06-1950 Gender: Female Account #: [id number] Procedure:                Colonoscopy Indications:              Positive Cologuard test Medicines:                Monitored Anesthesia Care Procedure:                Pre-Anesthesia Assessment:                           - Prior to the procedure, a History and Physical                            was performed, and patient medications and                            allergies were reviewed. The patient's tolerance of                            previous anesthesia was also reviewed. The risks                            and benefits of the procedure and the sedation                            options and risks were discussed with the patient.                            All questions were answered, and informed consent                            was obtained. Prior Anticoagulants: The patient has                            taken no previous anticoagulant or antiplatelet                            agents. ASA Grade Assessment: II - A patient with                            mild systemic disease. After reviewing the risks                            and benefits, the patient was deemed in                            satisfactory condition to undergo the procedure.                           After obtaining informed consent, the colonoscope  was passed under direct vision. Throughout the                            procedure, the patient's blood pressure, pulse, and                            oxygen saturations were monitored continuously. The                            Colonoscope was introduced through the anus and                            advanced to the the terminal ileum, with                            identification of the appendiceal orifice and  IC                            valve. The colonoscopy was performed with                            difficulty due to a redundant colon and a tortuous                            colon. Successful completion of the procedure was                            aided by using manual pressure and withdrawing the                            scope and replacing with the pediatric colonoscope.                            The patient tolerated the procedure well. The                            quality of the bowel preparation was good. The                            terminal ileum, ileocecal valve, appendiceal                            orifice, and rectum were photographed. The quality                            of the bowel preparation was evaluated using the                            BBPS [facility] Bowel Preparation Scale) with scores                            of: Right Colon = 2, Transverse Colon = 2 and Left  Colon = 2. The total BBPS score equals 6. Scope In: 10:46:31 AM Scope Out: 11:02:50 AM Scope Withdrawal Time: 0 hours 10 minutes 15 seconds  Total Procedure Duration: 0 hours 16 minutes 19 seconds  Findings:                 The perianal exam findings include internal                            hemorrhoids that prolapse with straining, but                            require manual replacement into the anal canal                            (Grade III).                           The terminal ileum appeared normal.                           The sigmoid colon was tortuous and redundant.                           A 3 mm polyp was found in the ascending colon. The                            polyp was sessile. The polyp was removed with a                            cold biopsy forceps. Resection and retrieval were                            complete.                           A 6 mm polyp was found in the transverse colon. The                            polyp was sessile.  The polyp was removed with a                            cold snare. Resection and retrieval were complete.                           The exam was otherwise without abnormality on                            direct and retroflexion views. Complications:            No immediate complications. Estimated blood loss:                            Minimal. Estimated Blood Loss:     Estimated blood loss was minimal. Impression:               -  Internal hemorrhoids that prolapse with                            straining, but require manual replacement into the                            anal canal (Grade III) found on perianal exam.                           - The examined portion of the ileum was normal.                           - Tortuous colon.                           - One 3 mm polyp in the ascending colon, removed                            with a cold biopsy forceps. Resected and retrieved.                           - One 6 mm polyp in the transverse colon, removed                            with a cold snare. Resected and retrieved.                           - The examination was otherwise normal on direct                            and retroflexion views. Recommendation:           - Patient has a contact number available for                            emergencies. The signs and symptoms of potential                            delayed complications were discussed with the                            patient. Return to normal activities tomorrow.                            Written discharge instructions were provided to the                            patient.                           - Resume previous diet.                           - Continue present medications.                           -  Await pathology results.                           - Repeat colonoscopy is recommended for                            surveillance. The colonoscopy date will be                            determined  after pathology results from today's                            exam become available for review. Bhavika Schnider L. Loletha Carrow, MD 12/04/2017 11:11:17 AM This report has been signed electronically.

## 2017-12-04 NOTE — Progress Notes (Signed)
To recovery, report to RN, VSS. 

## 2017-12-04 NOTE — Progress Notes (Signed)
Called to room to assist during endoscopic procedure.  Patient ID and intended procedure confirmed with present staff. Received instructions for my participation in the procedure from the performing physician.  

## 2017-12-05 ENCOUNTER — Telehealth: Payer: Self-pay | Admitting: *Deleted

## 2017-12-05 NOTE — Telephone Encounter (Signed)
No answer for follow up left message and will attempt to call back later this afternoon. Sm

## 2017-12-05 NOTE — Telephone Encounter (Signed)
  Follow up Call-  Call back number 12/04/2017  Post procedure Call Back phone  # [phone]  Permission to leave phone message Yes  Some recent data might be hidden     Patient questions:  Do you have a fever, pain , or abdominal swelling? No. Pain Score  0 *  Have you tolerated food without any problems? Yes.    Have you been able to return to your normal activities? Yes.    Do you have any questions about your discharge instructions: Diet   No. Medications  No. Follow up visit  No.  Do you have questions or concerns about your Care? No.  Actions: * If pain score is 4 or above: No action needed, pain <4.

## 2017-12-06 LAB — HM COLONOSCOPY

## 2017-12-11 ENCOUNTER — Encounter: Payer: Self-pay | Admitting: Gastroenterology

## 2017-12-28 DIAGNOSIS — R7989 Other specified abnormal findings of blood chemistry: Secondary | ICD-10-CM | POA: Diagnosis not present

## 2017-12-28 LAB — BASIC METABOLIC PANEL WITH GFR
BUN / CREAT RATIO: 14 (calc) (ref 6–22)
BUN: 14 mg/dL (ref 7–25)
CO2: 27 mmol/L (ref 20–32)
CREATININE: 1.03 mg/dL — AB (ref 0.50–0.99)
Calcium: 9.6 mg/dL (ref 8.6–10.4)
Chloride: 104 mmol/L (ref 98–110)
GFR, EST AFRICAN AMERICAN: 65 mL/min/{1.73_m2} (ref >=60)
GFR, EST NON AFRICAN AMERICAN: 56 mL/min/{1.73_m2} — AB (ref >=60)
Glucose, Bld: 108 mg/dL — ABNORMAL HIGH (ref 65–99)
Potassium: 4.3 mmol/L (ref 3.5–5.3)
SODIUM: 141 mmol/L (ref 135–146)

## 2017-12-30 ENCOUNTER — Encounter: Payer: Self-pay | Admitting: Family Medicine

## 2017-12-30 DIAGNOSIS — N183 Chronic kidney disease, stage 3 unspecified: Secondary | ICD-10-CM | POA: Insufficient documentation

## 2018-01-16 DIAGNOSIS — L570 Actinic keratosis: Secondary | ICD-10-CM | POA: Diagnosis not present

## 2018-01-16 DIAGNOSIS — D0439 Carcinoma in situ of skin of other parts of face: Secondary | ICD-10-CM | POA: Diagnosis not present

## 2018-01-16 DIAGNOSIS — Z85828 Personal history of other malignant neoplasm of skin: Secondary | ICD-10-CM | POA: Diagnosis not present

## 2018-01-16 DIAGNOSIS — Z08 Encounter for follow-up examination after completed treatment for malignant neoplasm: Secondary | ICD-10-CM | POA: Diagnosis not present

## 2018-01-16 DIAGNOSIS — D485 Neoplasm of uncertain behavior of skin: Secondary | ICD-10-CM | POA: Diagnosis not present

## 2018-02-05 ENCOUNTER — Other Ambulatory Visit: Payer: Self-pay | Admitting: Family Medicine

## 2018-04-08 ENCOUNTER — Other Ambulatory Visit: Payer: Self-pay | Admitting: Family Medicine

## 2018-07-02 ENCOUNTER — Other Ambulatory Visit: Payer: Self-pay | Admitting: Family Medicine

## 2018-08-07 ENCOUNTER — Other Ambulatory Visit: Payer: Self-pay | Admitting: Family Medicine

## 2018-08-16 ENCOUNTER — Encounter: Payer: Self-pay | Admitting: Family Medicine

## 2018-08-16 ENCOUNTER — Ambulatory Visit (INDEPENDENT_AMBULATORY_CARE_PROVIDER_SITE_OTHER): Payer: Medicare Other | Admitting: Family Medicine

## 2018-08-16 VITALS — BP 134/79 | HR 109 | Ht 67.0 in | Wt 173.0 lb

## 2018-08-16 DIAGNOSIS — Z7983 Long term (current) use of bisphosphonates: Secondary | ICD-10-CM | POA: Diagnosis not present

## 2018-08-16 DIAGNOSIS — N183 Chronic kidney disease, stage 3 unspecified: Secondary | ICD-10-CM

## 2018-08-16 DIAGNOSIS — R7301 Impaired fasting glucose: Secondary | ICD-10-CM

## 2018-08-16 DIAGNOSIS — Z1231 Encounter for screening mammogram for malignant neoplasm of breast: Secondary | ICD-10-CM | POA: Diagnosis not present

## 2018-08-16 DIAGNOSIS — M810 Age-related osteoporosis without current pathological fracture: Secondary | ICD-10-CM

## 2018-08-16 LAB — POCT GLYCOSYLATED HEMOGLOBIN (HGB A1C): HEMOGLOBIN A1C: 5.4 % (ref 4.0–5.6)

## 2018-08-16 MED ORDER — HYDROCHLOROTHIAZIDE 12.5 MG PO CAPS: 12.5000 mg | ORAL_CAPSULE | Freq: Every morning | ORAL | 2 refills | Status: DC

## 2018-08-16 MED ORDER — ALENDRONATE SODIUM 70 MG PO TABS: ORAL_TABLET | ORAL | 4 refills | Status: DC

## 2018-08-16 MED ORDER — HYDROCORTISONE 2.5 % EX CREA: TOPICAL_CREAM | CUTANEOUS | 1 refills | Status: DC

## 2018-08-16 NOTE — Progress Notes (Signed)
Subjective:    CC: BP and glucose  HPI:  Hypertension- Pt denies chest pain, SOB, dizziness, or heart palpitations.  Taking meds as directed w/o problems.  Denies medication side effects.    Impaired fasting glucose-no increased thirst or urination. No symptoms consistent with hypoglycemia.  Osteoporosis-she does need a refill on her Fosamax.  She is due for a bone density would like to schedule with her mammogram.  She does take her calcium and vitamin D daily.  She would like a refill on her hemorrhoid cream as well.  Chronic kidney disease stage III-no recent urinary symptoms.  Past medical history, Surgical history, Family history not pertinant except as noted below, Social history, Allergies, and medications have been entered into the medical record, reviewed, and corrections made.   Review of Systems: No fevers, chills, night sweats, weight loss, chest pain, or shortness of breath.   Objective:    General: Well Developed, well nourished, and in no acute distress.  Neuro: Alert and oriented x3, extra-ocular muscles intact, sensation grossly intact.  HEENT: Normocephalic, atraumatic  Skin: Warm and dry, no rashes. Cardiac: Regular rate and rhythm, no murmurs rubs or gallops, no lower extremity edema.  Respiratory: Clear to auscultation bilaterally. Not using accessory muscles, speaking in full sentences.   Impression and Recommendations:    HTN -controlled.  Continue current regimen.  Due for CMP.  IFG -stable.  Hemoglobin A1c looks fantastic today at 5.4.  Repeat in 1 year.  CKD 3-stable.  Due to repeat creatinine as well as urine microalbumin.

## 2018-08-23 DIAGNOSIS — R7301 Impaired fasting glucose: Secondary | ICD-10-CM | POA: Diagnosis not present

## 2018-08-23 DIAGNOSIS — N183 Chronic kidney disease, stage 3 (moderate): Secondary | ICD-10-CM | POA: Diagnosis not present

## 2018-08-24 LAB — COMPLETE METABOLIC PANEL WITH GFR
AG Ratio: 1.7 (calc) (ref 1.0–2.5)
ALKALINE PHOSPHATASE (APISO): 59 U/L (ref 33–130)
ALT: 28 U/L (ref 6–29)
AST: 20 U/L (ref 10–35)
Albumin: 4.4 g/dL (ref 3.6–5.1)
BUN/Creatinine Ratio: 16 (calc) (ref 6–22)
BUN: 17 mg/dL (ref 7–25)
CO2: 28 mmol/L (ref 20–32)
Calcium: 9.5 mg/dL (ref 8.6–10.4)
Chloride: 106 mmol/L (ref 98–110)
Creat: 1.04 mg/dL — ABNORMAL HIGH (ref 0.50–0.99)
GFR, Est African American: 64 mL/min/{1.73_m2} (ref >=60)
GFR, Est Non African American: 55 mL/min/{1.73_m2} — ABNORMAL LOW (ref >=60)
Globulin: 2.6 g/dL (calc) (ref 1.9–3.7)
Glucose, Bld: 106 mg/dL — ABNORMAL HIGH (ref 65–99)
Potassium: 3.9 mmol/L (ref 3.5–5.3)
Sodium: 143 mmol/L (ref 135–146)
Total Bilirubin: 0.6 mg/dL (ref 0.2–1.2)
Total Protein: 7 g/dL (ref 6.1–8.1)

## 2018-08-24 LAB — MICROALBUMIN / CREATININE URINE RATIO
CREATININE, URINE: 174 mg/dL (ref 20–275)
Microalb Creat Ratio: 17 mcg/mg creat (ref <30)
Microalb, Ur: 2.9 mg/dL

## 2018-09-04 ENCOUNTER — Ambulatory Visit (INDEPENDENT_AMBULATORY_CARE_PROVIDER_SITE_OTHER): Payer: Medicare Other

## 2018-09-04 ENCOUNTER — Encounter: Payer: Self-pay | Admitting: Family Medicine

## 2018-09-04 DIAGNOSIS — M8588 Other specified disorders of bone density and structure, other site: Secondary | ICD-10-CM

## 2018-09-04 DIAGNOSIS — Z1231 Encounter for screening mammogram for malignant neoplasm of breast: Secondary | ICD-10-CM | POA: Diagnosis not present

## 2018-09-04 DIAGNOSIS — Z780 Asymptomatic menopausal state: Secondary | ICD-10-CM | POA: Diagnosis not present

## 2018-09-04 DIAGNOSIS — M810 Age-related osteoporosis without current pathological fracture: Secondary | ICD-10-CM

## 2018-09-04 DIAGNOSIS — M8580 Other specified disorders of bone density and structure, unspecified site: Secondary | ICD-10-CM | POA: Insufficient documentation

## 2018-10-07 ENCOUNTER — Other Ambulatory Visit: Payer: Self-pay | Admitting: Family Medicine

## 2018-10-07 DIAGNOSIS — M810 Age-related osteoporosis without current pathological fracture: Secondary | ICD-10-CM

## 2018-12-17 ENCOUNTER — Other Ambulatory Visit: Payer: Self-pay

## 2018-12-17 MED ORDER — HYDROCORTISONE 2.5 % EX CREA: TOPICAL_CREAM | CUTANEOUS | 1 refills | Status: DC

## 2019-02-04 ENCOUNTER — Other Ambulatory Visit: Payer: Self-pay | Admitting: Family Medicine

## 2019-02-27 ENCOUNTER — Other Ambulatory Visit: Payer: Self-pay

## 2019-03-10 ENCOUNTER — Emergency Department (INDEPENDENT_AMBULATORY_CARE_PROVIDER_SITE_OTHER)
Admission: EM | Admit: 2019-03-10 | Discharge: 2019-03-10 | Disposition: A | Discharge status: Home / Self Care | Payer: Medicare Other | Source: Home / Self Care

## 2019-03-10 ENCOUNTER — Other Ambulatory Visit: Payer: Self-pay

## 2019-03-10 ENCOUNTER — Emergency Department (INDEPENDENT_AMBULATORY_CARE_PROVIDER_SITE_OTHER): Payer: Medicare Other

## 2019-03-10 DIAGNOSIS — S52532A Colles' fracture of left radius, initial encounter for closed fracture: Secondary | ICD-10-CM

## 2019-03-10 DIAGNOSIS — W19XXXA Unspecified fall, initial encounter: Secondary | ICD-10-CM

## 2019-03-10 DIAGNOSIS — S52615A Nondisplaced fracture of left ulna styloid process, initial encounter for closed fracture: Secondary | ICD-10-CM

## 2019-03-10 NOTE — ED Triage Notes (Signed)
About 2 hours ago, pt stepped out of kitchen, and fell over a dog, hit wall with back and tried to catch self with left hand, fell to floor.

## 2019-03-10 NOTE — ED Provider Notes (Signed)
[facility]    CSN: [id number] Arrival date & time: 03/10/19  1300     History   Chief Complaint Chief Complaint  Patient presents with  . Fall  . Wrist Pain  . Back Pain    HPI Margaret [name] is a 68 y.o. female.   [facility] initial visit  About 2 hours ago, pt stepped out of kitchen, and fell over a dog, hit wall with back and tried to catch self with left hand, fell to floor.    Left wrist diffusely swollen and painful with movement.  Patient also struck back and has some aching in lower thoracic spine.  Patient is being treated with bisphosphonates for osteopenia.     Past Medical History:  Diagnosis Date  . Cancer (HCC)    skin cancer-leg  . GERD (gastroesophageal reflux disease)   . Hyperlipidemia   . Hypertension   . Impaired fasting glucose     Patient Active Problem List   Diagnosis Date Noted  . Osteopenia 09/04/2018  . CKD (chronic kidney disease) stage 3, GFR 30-59 ml/min (HCC) 12/30/2017  . Rib pain on left side 07/17/2017  . History of basal cell carcinoma of skin 03/14/2016  . Cataract 04/26/2015  . Dry eye syndrome 04/26/2015  . Hemorrhoids 08/19/2013  . 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 Depression, recurrent ([place]) 03/17/2010  . INSULIN RESISTANCE SYNDROME 11/15/2009  . Vitamin D deficiency 11/15/2009  . ALLERGIC RHINITIS 11/02/2009  . 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 POLYARTHRITIS 04/08/2008  . GERD 03/04/2007  . HERNIATED DISC 03/04/2007  . Osteoporosis 03/04/2007  . Hyperlipidemia 01/04/2007  . Essential hypertension 01/04/2007  . IBS 01/04/2007  . ROSACEA 01/04/2007    Past Surgical History:  Procedure Laterality Date  . bladder tack    . CYSTOCELE REPAIR    . PILONIDAL CYST EXCISION    . VAGINAL HYSTERECTOMY      OB History   No obstetric history on file.      Home Medications    Prior to Admission medications   Medication Sig Start Date End Date Taking? Authorizing Provider  alendronate  (FOSAMAX) 70 MG tablet TAKE 1 TABLET WEEKLY TAKE ON EMPTY STOMACH WITH 8 OZ OF WATER(DO NOT LIE DOWN FOR 30 MINUTES) 10/07/18   Hali Marry, MD  Calcium Carbonate-Vit D-Min (CALCIUM 1200 PO) Take 2 capsules by mouth daily.    [provider]  Cetirizine HCl (ZYRTEC ALLERGY) 10 MG CAPS Take 1 capsule by mouth daily.    [provider]  FLUoxetine (PROZAC) 10 MG capsule TAKE ONE CAPSULE BY MOUTH DAILY. 08/07/18   Hali Marry, MD  hydrochlorothiazide (MICROZIDE) 12.5 MG capsule Take 1 capsule (12.5 mg total) by mouth every morning. 08/16/18   Hali Marry, MD  hydrocortisone 2.5 % cream Apply topically daily as needed. 12/17/18   Hali Marry, MD  ibuprofen (ADVIL,MOTRIN) 800 MG tablet Take 1 tablet (800 mg total) by mouth every 8 (eight) hours as needed. 07/17/17   Breeback, Royetta Car, PA-C  Multiple Vitamins-Minerals (CENTRUM SILVER ADULT 50+ PO) Take 1 capsule by mouth daily.    [provider]  ranitidine (ZANTAC) 150 MG tablet Take 150 mg by mouth daily.    [provider]  simvastatin (ZOCOR) 40 MG tablet Take 1 tablet (40 mg total) by mouth at bedtime. Needs appointment and lab work. 02/05/19   Hali Marry, MD    Family History Family History  Problem Relation Age of  Onset  . Stomach cancer Father 41       deceased  . Diabetes Father   . Cancer Father        stomach  . Bell's palsy Mother   . Parkinsonism Mother        died age 8  . Dementia Mother   . Asthma Mother   . Asthma Other   . Depression Other   . Bipolar disorder Other   . Diabetes Sister   . Diabetes Brother   . Colon polyps Brother   . Colon cancer Neg Hx   . Esophageal cancer Neg Hx     Social History Social History   Tobacco Use  . Smoking status: Former Smoker    Types: Cigarettes  . Smokeless tobacco: Never Used  Substance Use Topics  . Alcohol use: No  . Drug use: No     Allergies   Diphenhydramine hcl, Codeine, and Fish  oil   Review of Systems Review of Systems  Musculoskeletal: Positive for back pain and joint swelling.  All other systems reviewed and are negative.    Physical Exam Triage Vital Signs ED Triage Vitals  Enc Vitals Group     BP 03/10/19 1322 106/66     Pulse Rate 03/10/19 1322 99     Resp 03/10/19 1322 20     Temp 03/10/19 1322 97.9 F (36.6 C)     Temp Source 03/10/19 1322 Oral     SpO2 03/10/19 1322 96 %     Weight 03/10/19 1323 175 lb (79.4 kg)     Height 03/10/19 1323 5\' 7"  (1.702 m)     Head Circumference --      Peak Flow --      Pain Score 03/10/19 1323 5     Pain Loc --      Pain Edu? --      Excl. in [place]? --    No data found.  Updated Vital Signs BP 106/66 (BP Location: Right Arm)   Pulse 99   Temp 97.9 F (36.6 C) (Oral)   Resp 20   Ht 5\' 7"  (1.702 m)   Wt 79.4 kg   SpO2 96%   BMI 27.41 kg/m    Physical Exam Vitals signs and nursing note reviewed.  Constitutional:      Appearance: Normal appearance.  HENT:     Head: Normocephalic and atraumatic.  Eyes:     Conjunctiva/sclera: Conjunctivae normal.  Neck:     Musculoskeletal: Normal range of motion and neck supple.  Cardiovascular:     Rate and Rhythm: Normal rate.  Pulmonary:     Effort: Pulmonary effort is normal.  Musculoskeletal:        General: Swelling, tenderness and signs of injury present.     Comments: Swollen left wrist.  Unable to flex or extend secondary to pain  Minimally swollen and ecchymotic lower T spine area with no significant tenderness.  Normal back movement.  Skin:    General: Skin is warm and dry.  Neurological:     General: No focal deficit present.     Mental Status: She is alert.  Psychiatric:        Mood and Affect: Mood normal.        Behavior: Behavior normal.        Thought Content: Thought content normal.        Judgment: Judgment normal.      UC Treatments / Results  Labs (all labs ordered are  listed, but only abnormal results are displayed) Labs  Reviewed - No data to display  EKG   Radiology Dg Wrist Complete Left  Result Date: 03/10/2019 CLINICAL DATA:  [place] today and injured left wrist. EXAM: LEFT WRIST - COMPLETE 3+ VIEW COMPARISON:  None. FINDINGS: There is a comminuted intra-articular fracture of the distal radius with dorsal impaction (Colles type fracture). There is also a nondisplaced fracture through the base of the ulnar styloid. No other definite fractures are identified. Degenerative changes noted at the [facility] joint of the thumb. IMPRESSION: Dorsally impacted intra-articular fracture of the distal radius. Nondisplaced fracture at the base of the ulnar styloid. Electronically Signed   By: Marijo Sanes M.D.   On: 03/10/2019 14:00    Procedures Procedures (including critical care time)  Medications Ordered in UC Medications - No data to display  Initial Impression / Assessment and Plan / UC Course  I have reviewed the triage vital signs and the nursing notes.  Pertinent labs & imaging results that were available during my care of the patient were reviewed by me and considered in my medical decision making (see chart for details).    Final Clinical Impressions(s) / UC Diagnoses   Final diagnoses:  Closed Colles' fracture of left radius, initial encounter     Discharge Instructions     Follow up with Dr. Georgina Snell in a week    ED Prescriptions    None     Controlled Substance Prescriptions [place] Controlled Substance Registry consulted? Not Applicable   Robyn Haber, MD 03/10/19 1413

## 2019-03-10 NOTE — Discharge Instructions (Addendum)
Follow up with Dr. Georgina Snell in a week

## 2019-03-14 ENCOUNTER — Other Ambulatory Visit: Payer: Self-pay

## 2019-03-14 ENCOUNTER — Encounter: Payer: Self-pay | Admitting: Family Medicine

## 2019-03-14 ENCOUNTER — Ambulatory Visit (INDEPENDENT_AMBULATORY_CARE_PROVIDER_SITE_OTHER): Payer: Medicare Other | Admitting: Family Medicine

## 2019-03-14 VITALS — BP 126/74 | HR 78 | Ht 67.0 in | Wt 176.0 lb

## 2019-03-14 DIAGNOSIS — N183 Chronic kidney disease, stage 3 unspecified: Secondary | ICD-10-CM

## 2019-03-14 DIAGNOSIS — E348 Other specified endocrine disorders: Secondary | ICD-10-CM | POA: Diagnosis not present

## 2019-03-14 DIAGNOSIS — E785 Hyperlipidemia, unspecified: Secondary | ICD-10-CM | POA: Diagnosis not present

## 2019-03-14 DIAGNOSIS — I10 Essential (primary) hypertension: Secondary | ICD-10-CM | POA: Diagnosis not present

## 2019-03-14 LAB — POCT GLYCOSYLATED HEMOGLOBIN (HGB A1C): Hemoglobin A1C: 5.4 % (ref 4.0–5.6)

## 2019-03-14 MED ORDER — SIMVASTATIN 40 MG PO TABS: 40.0000 mg | ORAL_TABLET | Freq: Every day | ORAL | 3 refills | Status: DC

## 2019-03-14 NOTE — Assessment & Plan Note (Signed)
Recheck in one year. Looks great today!

## 2019-03-14 NOTE — Assessment & Plan Note (Signed)
Continue statin. Labs due.

## 2019-03-14 NOTE — Progress Notes (Signed)
Established Patient Office Visit  Subjective:  Patient ID: Margaret [name], female    DOB: 02/10/1951  Age: 68 y.o. MRN: [id number]  CC:  Chief Complaint  Patient presents with  . Hyperlipidemia  . ifg    HPI Margaret [name] presents for   Hypertension- Pt denies chest pain, SOB, dizziness, or heart palpitations.  Taking meds as directed w/o problems.  Denies medication side effects.    Impaired fasting glucose-no increased thirst or urination. No symptoms consistent with hypoglycemia.  F/U CKD 3 - no recent changes to urination.  Hyperlipidemia - tolerating stating well with no myalgias or significant side effects.   She also fractured her left radius about 4 days ago.  She fell backwards over her daughter's large dogs while they were try to cook in the kitchen.  She landed on that arm and fractured it.  She is a follow-up on Monday.  Lab Results  Component Value Date   CHOL 160 10/04/2017   CHOL 177 09/08/2016   CHOL 210 (H) 08/22/2013   Lab Results  Component Value Date   HDL 45 (L) 10/04/2017   HDL 46 (L) 09/08/2016   HDL 44 08/22/2013   Lab Results  Component Value Date   LDLCALC 90 10/04/2017   LDLCALC 96 09/08/2016   LDLCALC 123 (H) 08/22/2013   Lab Results  Component Value Date   TRIG 150 (H) 10/04/2017   TRIG 177 (H) 09/08/2016   TRIG 214 (H) 08/22/2013   Lab Results  Component Value Date   CHOLHDL 3.6 10/04/2017   CHOLHDL 3.8 09/08/2016   CHOLHDL 4.8 08/22/2013   No results found for: LDLDIRECT   Past Medical History:  Diagnosis Date  . Cancer (HCC)    skin cancer-leg  . GERD (gastroesophageal reflux disease)   . Hyperlipidemia   . Hypertension   . Impaired fasting glucose     Past Surgical History:  Procedure Laterality Date  . bladder tack    . CYSTOCELE REPAIR    . PILONIDAL CYST EXCISION    . VAGINAL HYSTERECTOMY      Family History  Problem Relation Age of Onset  . Stomach cancer Father 76       deceased  . Diabetes  Father   . Cancer Father        stomach  . Bell's palsy Mother   . Parkinsonism Mother        died age 4  . Dementia Mother   . Asthma Mother   . Asthma Other   . Depression Other   . Bipolar disorder Other   . Diabetes Sister   . Diabetes Brother   . Colon polyps Brother   . Colon cancer Neg Hx   . Esophageal cancer Neg Hx     Social History   Socioeconomic History  . Marital status: Single    Spouse name: Not on file  . Number of children: Not on file  . Years of education: Not on file  . Highest education level: Not on file  Occupational History  . Not on file  Social Needs  . Financial resource strain: Not on file  . Food insecurity    Worry: Not on file    Inability: Not on file  . Transportation needs    Medical: Not on file    Non-medical: Not on file  Tobacco Use  . Smoking status: Former Smoker    Types: Cigarettes  . Smokeless tobacco: Never Used  Substance and  Sexual Activity  . Alcohol use: No  . Drug use: No  . Sexual activity: Not on file  Lifestyle  . Physical activity    Days per week: Not on file    Minutes per session: Not on file  . Stress: Not on file  Relationships  . Social Herbalist on phone: Not on file    Gets together: Not on file    Attends religious service: Not on file    Active member of club or organization: Not on file    Attends meetings of clubs or organizations: Not on file    Relationship status: Not on file  . Intimate partner violence    Fear of current or ex partner: Not on file    Emotionally abused: Not on file    Physically abused: Not on file    Forced sexual activity: Not on file  Other Topics Concern  . Not on file  Social History Narrative   Reviewed history from 10/13/2009 and no changes required.   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    Drug use-no   Regular exercise-no, at work   Diet: "graze during  the day" no fruit, lots of veggies    Outpatient Medications Prior to Visit  Medication Sig Dispense Refill  . alendronate (FOSAMAX) 70 MG tablet TAKE 1 TABLET WEEKLY TAKE ON EMPTY STOMACH WITH 8 OZ OF WATER(DO NOT LIE DOWN FOR 30 MINUTES) 12 tablet 11  . Calcium Carbonate-Vit D-Min (CALCIUM 1200 PO) Take 2 capsules by mouth daily.    . Cetirizine HCl (ZYRTEC ALLERGY) 10 MG CAPS Take 1 capsule by mouth daily.    Marland Kitchen FLUoxetine (PROZAC) 10 MG capsule TAKE ONE CAPSULE BY MOUTH DAILY. 90 capsule 2  . hydrochlorothiazide (MICROZIDE) 12.5 MG capsule Take 1 capsule (12.5 mg total) by mouth every morning. 90 capsule 2  . hydrocortisone 2.5 % cream Apply topically daily as needed. 30 g 1  . ibuprofen (ADVIL,MOTRIN) 800 MG tablet Take 1 tablet (800 mg total) by mouth every 8 (eight) hours as needed. 60 tablet 0  . Multiple Vitamins-Minerals (CENTRUM SILVER ADULT 50+ PO) Take 1 capsule by mouth daily.    . ranitidine (ZANTAC) 150 MG tablet Take 150 mg by mouth daily.    . simvastatin (ZOCOR) 40 MG tablet Take 1 tablet (40 mg total) by mouth at bedtime. Needs appointment and lab work. 30 tablet 0   No facility-administered medications prior to visit.     Allergies  Allergen Reactions  . Diphenhydramine Hcl Swelling and Other (See Comments)  . Codeine Nausea And Vomiting  . Fish Oil Other (See Comments)    REACTION: vomiting.    ROS Review of Systems    Objective:    Physical Exam  Constitutional: She is oriented to person, place, and time. She appears well-developed and well-nourished.  HENT:  Head: Normocephalic and atraumatic.  Cardiovascular: Normal rate and regular rhythm.  2/6 SEM  Pulmonary/Chest: Effort normal and breath sounds normal.  Neurological: She is alert and oriented to person, place, and time.  Skin: Skin is warm and dry.  Psychiatric: She has a normal mood and affect. Her behavior is normal.    BP 126/74   Pulse 78   Ht 5\' 7"  (1.702 m)   Wt 176 lb (79.8 kg)    SpO2 100%   BMI 27.57 kg/m  Wt Readings  from Last 3 Encounters:  03/14/19 176 lb (79.8 kg)  03/10/19 175 lb (79.4 kg)  08/16/18 173 lb (78.5 kg)     There are no preventive care reminders to display for this patient.  There are no preventive care reminders to display for this patient.  Lab Results  Component Value Date   TSH 2.994 08/22/2013   Lab Results  Component Value Date   WBC 5.8 11/08/2010   HGB 14.6 11/08/2010   HCT 42.9 11/08/2010   MCV 91.1 11/08/2010   PLT 251 11/08/2010   Lab Results  Component Value Date   NA 143 08/23/2018   K 3.9 08/23/2018   CO2 28 08/23/2018   GLUCOSE 106 (H) 08/23/2018   BUN 17 08/23/2018   CREATININE 1.04 (H) 08/23/2018   BILITOT 0.6 08/23/2018   ALKPHOS 60 09/08/2016   AST 20 08/23/2018   ALT 28 08/23/2018   PROT 7.0 08/23/2018   ALBUMIN 4.3 09/08/2016   CALCIUM 9.5 08/23/2018   Lab Results  Component Value Date   CHOL 160 10/04/2017   Lab Results  Component Value Date   HDL 45 (L) 10/04/2017   Lab Results  Component Value Date   LDLCALC 90 10/04/2017   Lab Results  Component Value Date   TRIG 150 (H) 10/04/2017   Lab Results  Component Value Date   CHOLHDL 3.6 10/04/2017   Lab Results  Component Value Date   HGBA1C 5.4 03/14/2019      Assessment & Plan:   Problem List Items Addressed This Visit      Cardiovascular and Mediastinum   Essential hypertension - Primary    Well controlled. Continue current regimen. Follow up in  6 months.       Relevant Medications   simvastatin (ZOCOR) 40 MG tablet   Other Relevant Orders   Lipid panel   BASIC METABOLIC PANEL WITH GFR     Endocrine   RESOLVED: INSULIN RESISTANCE SYNDROME    Recheck in one year. Looks great today!       Relevant Orders   POCT glycosylated hemoglobin (Hb A1C) (Completed)   Lipid panel   BASIC METABOLIC PANEL WITH GFR     Genitourinary   CKD (chronic kidney disease) stage 3, GFR 30-59 ml/min (HCC)    Due to recheck renal  function, follow Q6 mo      Relevant Orders   POCT glycosylated hemoglobin (Hb A1C) (Completed)   Lipid panel   BASIC METABOLIC PANEL WITH GFR     Other   Hyperlipidemia    Continue statin. Labs due.       Relevant Medications   simvastatin (ZOCOR) 40 MG tablet   Other Relevant Orders   Lipid panel   BASIC METABOLIC PANEL WITH GFR      Meds ordered this encounter  Medications  . simvastatin (ZOCOR) 40 MG tablet    Sig: Take 1 tablet (40 mg total) by mouth at bedtime.    Dispense:  90 tablet    Refill:  3    Follow-up: Return in about 6 months (around 09/14/2019) for Hypertension.    Beatrice Lecher, MD

## 2019-03-14 NOTE — Assessment & Plan Note (Signed)
Well controlled. Continue current regimen. Follow up in  6 months.  

## 2019-03-14 NOTE — Assessment & Plan Note (Signed)
Due to recheck renal function, follow Q6 mo

## 2019-03-17 ENCOUNTER — Encounter: Payer: Self-pay | Admitting: Family Medicine

## 2019-03-17 ENCOUNTER — Ambulatory Visit (INDEPENDENT_AMBULATORY_CARE_PROVIDER_SITE_OTHER): Payer: Medicare Other | Admitting: Family Medicine

## 2019-03-17 ENCOUNTER — Other Ambulatory Visit: Payer: Self-pay

## 2019-03-17 ENCOUNTER — Ambulatory Visit (INDEPENDENT_AMBULATORY_CARE_PROVIDER_SITE_OTHER): Payer: Medicare Other

## 2019-03-17 VITALS — BP 142/93 | HR 99 | Temp 98.1°F | Ht 67.0 in | Wt 175.7 lb

## 2019-03-17 DIAGNOSIS — S52531A Colles' fracture of right radius, initial encounter for closed fracture: Secondary | ICD-10-CM

## 2019-03-17 DIAGNOSIS — S52532A Colles' fracture of left radius, initial encounter for closed fracture: Secondary | ICD-10-CM | POA: Diagnosis not present

## 2019-03-17 DIAGNOSIS — S52502A Unspecified fracture of the lower end of left radius, initial encounter for closed fracture: Secondary | ICD-10-CM | POA: Diagnosis not present

## 2019-03-17 MED ORDER — HYDROCODONE-ACETAMINOPHEN 5-325 MG PO TABS: 1.0000 | ORAL_TABLET | Freq: Four times a day (QID) | ORAL | 0 refills | Status: DC | PRN

## 2019-03-17 NOTE — Patient Instructions (Signed)
Thank you for coming in today. Use hydrocodone springly for severe pain.  Recheck in 2 week.  Return sooner if needed.    Cast or Splint Care, Adult Casts and splints are supports that are worn to protect broken bones and other injuries. A cast or splint may hold a bone still and in the correct position while it heals. Casts and splints may also help ease pain, swelling, and muscle spasms. A cast is a hardened support that is usually made of fiberglass or plaster. It is custom-fit to the body and it offers more protection than a splint. It cannot be taken off and put back on. A splint is a type of soft support that is usually made from cloth and elastic. It can be adjusted or taken off as needed. You may need a cast or a splint if you:  Have a broken bone.  Have a soft-tissue injury.  Need to keep an injured body part from moving (keep it immobile) after surgery. How is this treated? If you have a cast:   Do not stick anything inside the cast to scratch your skin. Sticking something in the cast increases your risk of infection.  Check the skin around the cast every day. Tell your health care provider about any concerns.  You may put lotion on dry skin around the edges of the cast. Do not put lotion on the skin underneath the cast.  Keep the cast clean.  If the cast is not waterproof: ? Do not let it get wet. ? Cover it with a watertight covering when you take a bath or a shower. If you have a splint:   Wear it as told by your health care provider. Remove it only as told by your health care provider.  Loosen the splint if your fingers or toes tingle, become numb, or turn cold and blue.  Keep the splint clean.  If the splint is not waterproof: ? Do not let it get wet. ? Cover it with a watertight covering when you take a bath or a shower. Bathing  Do not take baths or swim until your health care provider approves. Ask your health care provider if you can take showers. You  may only be allowed to take sponge baths for bathing.  If your cast or splint is not waterproof, cover it with a watertight covering when you take a bath or shower. Managing pain, stiffness, and swelling  Move your fingers or toes often to avoid stiffness and to lessen swelling.  Raise (elevate) the injured area above the level of your heart while sitting or lying down. Safety  Do not use the injured limb to support your body weight until your health care provider says that it is okay.  Use crutches or other assistive devices as told by your health care provider. General instructions  Do not put pressure on any part of the cast or splint until it is fully hardened. This may take several hours.  Return to your normal activities as told by your health care provider. Ask your health care provider what activities are safe for you.  Take over-the-counter and prescription medicines only as told by your health care provider.  Keep all follow-up visits as told by your health care provider. This is important. Contact a health care provider if:  Your cast or splint gets damaged.  The skin around the cast gets red or raw.  The skin under the cast is extremely itchy or painful.  Your  cast or splint feels very uncomfortable.  Your cast or splint is too tight or too loose.  Your cast becomes wet or it develops a soft spot or area.  You get an object stuck under your cast. Get help right away if:  Your pain is getting worse.  The injured area tingles, becomes numb, or turns cold and blue.  The part of your body above or below the cast is swollen and discolored.  You cannot feel or move your fingers or toes.  There is fluid leaking through the cast.  You have severe pain or pressure under the cast.  You have trouble breathing.  You have shortness of breath.  You have chest pain. This information is not intended to replace advice given to you by your health care provider. Make  sure you discuss any questions you have with your health care provider. Document Released: 07/14/2000 Document Revised: 05/14/2017 Document Reviewed: 01/08/2016 Elsevier Patient Education  2020 [employer].

## 2019-03-17 NOTE — Progress Notes (Signed)
Margaret [name] is a 68 y.o. female who presents to [place] today for follow-up left distal radius fracture.  Patient tripped over her dog and landed on her left wrist on August 10.  She was seen in urgent care where she was found to have a minimally displaced intra-articular fracture of the left distal radius.  The ulnar styloid was also fractured.  She seen in urgent care in the fracture alignment was sufficient for conservative management attempt.  She was placed into a sugar tong splint and is following up today.  She has in the interim she has had some hand swelling and some discomfort and pain.  She takes ibuprofen which does help.  She is having a little bit of trouble sleeping because of the pain.  She feels well otherwise.   ROS:  As above  Exam:  BP (!) 142/93 (BP Location: Left Arm, Patient Position: Sitting, Cuff Size: Normal)   Pulse 99   Temp 98.1 F (36.7 C) (Oral)   Ht 5\' 7"  (1.702 m)   Wt 175 lb 11.2 oz (79.7 kg)   BMI 27.52 kg/m  Wt Readings from Last 5 Encounters:  03/17/19 175 lb 11.2 oz (79.7 kg)  03/14/19 176 lb (79.8 kg)  03/10/19 175 lb (79.4 kg)  08/16/18 173 lb (78.5 kg)  12/04/17 180 lb (81.6 kg)   General: Well Developed, well nourished, and in no acute distress.  Neuro/Psych: Alert and oriented x3, extra-ocular muscles intact, able to move all 4 extremities, sensation grossly intact. Skin: Warm and dry, no rashes noted.  Respiratory: Not using accessory muscles, speaking in full sentences, trachea midline.  Cardiovascular: Pulses palpable, no extremity edema. Abdomen: Does not appear distended. MSK: Left hand and wrist: Swollen at MCPs distal to where the Ace wrap of the splint ended.  Otherwise no significant swelling.  No other deformity.  Bruising present along forearm.  Mildly tender to palpation distal radius.  Pulses cap refill and sensation are intact.  Motion of wrist and elbow not tested.   Lab  and Radiology Results Results for orders placed or performed in visit on 03/14/19 (from the past 72 hour(s))  POCT glycosylated hemoglobin (Hb A1C)     Status: Normal   Collection Time: 03/14/19  2:43 PM  Result Value Ref Range   Hemoglobin A1C 5.4 4.0 - 5.6 %   HbA1c POC (<> result, manual entry)     HbA1c, POC (prediabetic range)     HbA1c, POC (controlled diabetic range)     No results found.  X-ray left wrist images obtained today personally and independently reviewed Stable appearance of fracture alignment.  Periosteal reabsorption present with early healing.  Comminuted distal radius impaction fracture with slight angulation. Await formal radiology overread  Patient was placed into a long-arm cast circularized overlying sugar tong splint  Assessment and Plan: 68 y.o. female with left distal radius fracture.  Clinically doing reasonably well.  Plan to transition to long-arm cast today.  Check back in 2 weeks or sooner if needed.  We will order Norco for limited pain control.  Check back in 2 weeks.   PDMP not reviewed this encounter. Orders Placed This Encounter  Procedures  . DG Wrist Complete Left    Standing Status:   Future    Standing Expiration Date:   05/16/2020    Order Specific Question:   Reason for Exam (SYMPTOM  OR DIAGNOSIS REQUIRED)    Answer:   wrist fracture  follow up    Order Specific Question:   Preferred imaging location?    Answer:   [facility]    Order Specific Question:   Radiology Contrast Protocol - do NOT remove file path    Answer:   \\[email or web address]   No orders of the defined types were placed in this encounter.   Historical information moved to improve visibility of documentation.  Past Medical History:  Diagnosis Date  . Cancer (HCC)    skin cancer-leg  . GERD (gastroesophageal reflux disease)   . Hyperlipidemia   . Hypertension   . Impaired fasting glucose    Past Surgical History:   Procedure Laterality Date  . bladder tack    . CYSTOCELE REPAIR    . PILONIDAL CYST EXCISION    . VAGINAL HYSTERECTOMY     Social History   Tobacco Use  . Smoking status: Former Smoker    Types: Cigarettes  . Smokeless tobacco: Never Used  Substance Use Topics  . Alcohol use: No   family history includes Asthma in her mother and another family member; Bell's palsy in her mother; Bipolar disorder in an other family member; Cancer in her father; Colon polyps in her brother; Dementia in her mother; Depression in an other family member; Diabetes in her brother, father, and sister; Parkinsonism in her mother; Stomach cancer (age of onset: 108) in her father.  Medications: Current Outpatient Medications  Medication Sig Dispense Refill  . alendronate (FOSAMAX) 70 MG tablet TAKE 1 TABLET WEEKLY TAKE ON EMPTY STOMACH WITH 8 OZ OF WATER(DO NOT LIE DOWN FOR 30 MINUTES) 12 tablet 11  . Calcium Carbonate-Vit D-Min (CALCIUM 1200 PO) Take 2 capsules by mouth daily.    . Cetirizine HCl (ZYRTEC ALLERGY) 10 MG CAPS Take 1 capsule by mouth daily.    Marland Kitchen FLUoxetine (PROZAC) 10 MG capsule TAKE ONE CAPSULE BY MOUTH DAILY. 90 capsule 2  . hydrochlorothiazide (MICROZIDE) 12.5 MG capsule Take 1 capsule (12.5 mg total) by mouth every morning. 90 capsule 2  . hydrocortisone 2.5 % cream Apply topically daily as needed. 30 g 1  . ibuprofen (ADVIL,MOTRIN) 800 MG tablet Take 1 tablet (800 mg total) by mouth every 8 (eight) hours as needed. 60 tablet 0  . Multiple Vitamins-Minerals (CENTRUM SILVER ADULT 50+ PO) Take 1 capsule by mouth daily.    . ranitidine (ZANTAC) 150 MG tablet Take 150 mg by mouth daily.    . simvastatin (ZOCOR) 40 MG tablet Take 1 tablet (40 mg total) by mouth at bedtime. 90 tablet 3   No current facility-administered medications for this visit.    Allergies  Allergen Reactions  . Diphenhydramine Hcl Swelling and Other (See Comments)  . Codeine Nausea And Vomiting  . Fish Oil Other (See  Comments)    REACTION: vomiting.      Discussed warning signs or symptoms. Please see discharge instructions. Patient expresses understanding.

## 2019-03-18 ENCOUNTER — Other Ambulatory Visit: Payer: Self-pay | Admitting: *Deleted

## 2019-03-18 MED ORDER — HYDROCHLOROTHIAZIDE 12.5 MG PO CAPS: 12.5000 mg | ORAL_CAPSULE | Freq: Every morning | ORAL | 2 refills | Status: DC

## 2019-03-20 DIAGNOSIS — E785 Hyperlipidemia, unspecified: Secondary | ICD-10-CM | POA: Diagnosis not present

## 2019-03-20 DIAGNOSIS — E348 Other specified endocrine disorders: Secondary | ICD-10-CM | POA: Diagnosis not present

## 2019-03-20 DIAGNOSIS — I10 Essential (primary) hypertension: Secondary | ICD-10-CM | POA: Diagnosis not present

## 2019-03-20 DIAGNOSIS — N183 Chronic kidney disease, stage 3 (moderate): Secondary | ICD-10-CM | POA: Diagnosis not present

## 2019-03-21 LAB — BASIC METABOLIC PANEL WITH GFR
BUN/Creatinine Ratio: 18 (calc) (ref 6–22)
BUN: 21 mg/dL (ref 7–25)
CO2: 28 mmol/L (ref 20–32)
Calcium: 9.6 mg/dL (ref 8.6–10.4)
Chloride: 107 mmol/L (ref 98–110)
Creat: 1.2 mg/dL — ABNORMAL HIGH (ref 0.50–0.99)
GFR, Est African American: 54 mL/min/{1.73_m2} — ABNORMAL LOW (ref >=60)
GFR, Est Non African American: 46 mL/min/{1.73_m2} — ABNORMAL LOW (ref >=60)
Glucose, Bld: 122 mg/dL — ABNORMAL HIGH (ref 65–99)
Potassium: 4.3 mmol/L (ref 3.5–5.3)
Sodium: 144 mmol/L (ref 135–146)

## 2019-03-21 LAB — LIPID PANEL
Cholesterol: 187 mg/dL (ref <200)
HDL: 44 mg/dL — ABNORMAL LOW (ref >=50)
LDL Cholesterol (Calc): 118 mg/dL (calc) — ABNORMAL HIGH
Non-HDL Cholesterol (Calc): 143 mg/dL (calc) — ABNORMAL HIGH (ref <130)
Total CHOL/HDL Ratio: 4.3 (calc) (ref <5.0)
Triglycerides: 142 mg/dL (ref <150)

## 2019-03-31 ENCOUNTER — Encounter: Payer: Self-pay | Admitting: Family Medicine

## 2019-03-31 ENCOUNTER — Other Ambulatory Visit: Payer: Self-pay

## 2019-03-31 ENCOUNTER — Ambulatory Visit (INDEPENDENT_AMBULATORY_CARE_PROVIDER_SITE_OTHER): Payer: Medicare Other

## 2019-03-31 ENCOUNTER — Ambulatory Visit (INDEPENDENT_AMBULATORY_CARE_PROVIDER_SITE_OTHER): Payer: Medicare Other | Admitting: Family Medicine

## 2019-03-31 VITALS — BP 131/81 | HR 91 | Temp 98.0°F | Wt 175.0 lb

## 2019-03-31 DIAGNOSIS — S52532A Colles' fracture of left radius, initial encounter for closed fracture: Secondary | ICD-10-CM

## 2019-03-31 DIAGNOSIS — S52502D Unspecified fracture of the lower end of left radius, subsequent encounter for closed fracture with routine healing: Secondary | ICD-10-CM | POA: Diagnosis not present

## 2019-03-31 DIAGNOSIS — S52615D Nondisplaced fracture of left ulna styloid process, subsequent encounter for closed fracture with routine healing: Secondary | ICD-10-CM | POA: Diagnosis not present

## 2019-03-31 NOTE — Patient Instructions (Signed)
Thank you for coming in today. Recheck in 2 weeks.  We will get xray then.  Return sooner if needed.

## 2019-03-31 NOTE — Progress Notes (Signed)
Margaret [name] is a 68 y.o. female who presents to [place] today for left distal radius fracture.  Patient tripped over her dog landing on her left wrist on August 10.  She was seen in urgent care where she was found to have a minimally displaced intra-articular fracture at the left distal radius and a small ulnar styloid fracture in addition.  She was treated with sugar tong splint that was transition to long-arm cast on the 17th.  In the interim she notes that she is doing well with no pain.    ROS:  As above  Exam:  BP 131/81   Pulse 91   Temp 98 F (36.7 C) (Oral)   Wt 175 lb (79.4 kg)   BMI 27.41 kg/m  Wt Readings from Last 5 Encounters:  03/31/19 175 lb (79.4 kg)  03/17/19 175 lb 11.2 oz (79.7 kg)  03/14/19 176 lb (79.8 kg)  03/10/19 175 lb (79.4 kg)  08/16/18 173 lb (78.5 kg)   General: Well Developed, well nourished, and in no acute distress.  Neuro/Psych: Alert and oriented x3, extra-ocular muscles intact, able to move all 4 extremities, sensation grossly intact. Skin: Warm and dry, no rashes noted.  Respiratory: Not using accessory muscles, speaking in full sentences, trachea midline.  Cardiovascular: Pulses palpable, no extremity edema. Abdomen: Does not appear distended. MSK: Left wrist.  Bruising present no deformity.  Tender to palpation distal radius mildly.  Pulses cap refill sensation intact distally.    Lab and Radiology Results X-ray images left wrist obtained today personally independently reviewed. Fracture alignment is preserved with no change in angulation.  Some early callus formation present. Await formal radiology review    Assessment and Plan: 68 y.o. female with left wrist fracture.  Doing well.  Plan to transition to EXOS cast.  Recheck in 2 weeks.  Precautions reviewed return sooner if needed.   PDMP not reviewed this encounter. Orders Placed This Encounter  Procedures  . DG Wrist  Complete Left    Standing Status:   Future    Number of Occurrences:   1    Standing Expiration Date:   05/30/2020    Order Specific Question:   Reason for Exam (SYMPTOM  OR DIAGNOSIS REQUIRED)    Answer:   f/u fx    Order Specific Question:   Preferred imaging location?    Answer:   [facility]    Order Specific Question:   Radiology Contrast Protocol - do NOT remove file path    Answer:   \\[email or web address]   No orders of the defined types were placed in this encounter.   Historical information moved to improve visibility of documentation.  Past Medical History:  Diagnosis Date  . Cancer (HCC)    skin cancer-leg  . GERD (gastroesophageal reflux disease)   . Hyperlipidemia   . Hypertension   . Impaired fasting glucose    Past Surgical History:  Procedure Laterality Date  . bladder tack    . CYSTOCELE REPAIR    . PILONIDAL CYST EXCISION    . VAGINAL HYSTERECTOMY     Social History   Tobacco Use  . Smoking status: Former Smoker    Types: Cigarettes  . Smokeless tobacco: Never Used  Substance Use Topics  . Alcohol use: No   family history includes Asthma in her mother and another family member; Bell's palsy in her mother; Bipolar disorder in an other family member; Cancer in her father; Colon polyps  in her brother; Dementia in her mother; Depression in an other family member; Diabetes in her brother, father, and sister; Parkinsonism in her mother; Stomach cancer (age of onset: 38) in her father.  Medications: Current Outpatient Medications  Medication Sig Dispense Refill  . alendronate (FOSAMAX) 70 MG tablet TAKE 1 TABLET WEEKLY TAKE ON EMPTY STOMACH WITH 8 OZ OF WATER(DO NOT LIE DOWN FOR 30 MINUTES) 12 tablet 11  . Calcium Carbonate-Vit D-Min (CALCIUM 1200 PO) Take 2 capsules by mouth daily.    . Cetirizine HCl (ZYRTEC ALLERGY) 10 MG CAPS Take 1 capsule by mouth daily.    Marland Kitchen FLUoxetine (PROZAC) 10 MG capsule TAKE ONE  CAPSULE BY MOUTH DAILY. 90 capsule 2  . hydrochlorothiazide (MICROZIDE) 12.5 MG capsule Take 1 capsule (12.5 mg total) by mouth every morning. 90 capsule 2  . HYDROcodone-acetaminophen (NORCO/VICODIN) 5-325 MG tablet Take 1 tablet by mouth every 6 (six) hours as needed. 10 tablet 0  . hydrocortisone 2.5 % cream Apply topically daily as needed. 30 g 1  . ibuprofen (ADVIL,MOTRIN) 800 MG tablet Take 1 tablet (800 mg total) by mouth every 8 (eight) hours as needed. 60 tablet 0  . Multiple Vitamins-Minerals (CENTRUM SILVER ADULT 50+ PO) Take 1 capsule by mouth daily.    . ranitidine (ZANTAC) 150 MG tablet Take 150 mg by mouth daily.    . simvastatin (ZOCOR) 40 MG tablet Take 1 tablet (40 mg total) by mouth at bedtime. 90 tablet 3   No current facility-administered medications for this visit.    Allergies  Allergen Reactions  . Diphenhydramine Hcl Swelling and Other (See Comments)  . Codeine Nausea And Vomiting  . Fish Oil Other (See Comments)    REACTION: vomiting.      Discussed warning signs or symptoms. Please see discharge instructions. Patient expresses understanding.

## 2019-04-11 ENCOUNTER — Ambulatory Visit (INDEPENDENT_AMBULATORY_CARE_PROVIDER_SITE_OTHER): Payer: Medicare Other | Admitting: Family Medicine

## 2019-04-11 ENCOUNTER — Encounter: Payer: Self-pay | Admitting: Family Medicine

## 2019-04-11 ENCOUNTER — Ambulatory Visit (INDEPENDENT_AMBULATORY_CARE_PROVIDER_SITE_OTHER): Payer: Medicare Other

## 2019-04-11 ENCOUNTER — Other Ambulatory Visit: Payer: Self-pay

## 2019-04-11 VITALS — BP 149/90 | HR 90 | Temp 97.6°F | Ht 67.0 in | Wt 174.4 lb

## 2019-04-11 DIAGNOSIS — L2389 Allergic contact dermatitis due to other agents: Secondary | ICD-10-CM | POA: Diagnosis not present

## 2019-04-11 DIAGNOSIS — S52532A Colles' fracture of left radius, initial encounter for closed fracture: Secondary | ICD-10-CM | POA: Diagnosis not present

## 2019-04-11 DIAGNOSIS — S52592D Other fractures of lower end of left radius, subsequent encounter for closed fracture with routine healing: Secondary | ICD-10-CM | POA: Diagnosis not present

## 2019-04-11 MED ORDER — TRIAMCINOLONE ACETONIDE 0.5 % EX CREA: 1.0000 "application " | TOPICAL_CREAM | Freq: Two times a day (BID) | CUTANEOUS | 3 refills | Status: DC

## 2019-04-11 NOTE — Progress Notes (Signed)
Margaret [name] is a 68 y.o. female who presents to [facility] today for rash.  Patient has been treated for about 5 weeks now for left distal radius fracture.  At her follow-up visit on August 31 she was transition to short arm EXOS cast.  She notes that it was itchy at first worsening slightly.  She took the cast off this morning to bathe her arm more properly and it was very red with a rash and very itchy.  She had not remove the cast since it was placed on on August 31.  She did remember to dry the arm after she got it wet.  She notes her skin is typically pretty sensitive and thinks maybe she is allergic to the EXOS cast.    ROS as above:  Exam:  BP (!) 149/90    Pulse 90    Temp 97.6 F (36.4 C) (Oral)    Ht 5\' 7"  (1.702 m)    Wt 174 lb 6.4 oz (79.1 kg)    BMI 27.31 kg/m  Wt Readings from Last 5 Encounters:  04/11/19 174 lb 6.4 oz (79.1 kg)  03/31/19 175 lb (79.4 kg)  03/17/19 175 lb 11.2 oz (79.7 kg)  03/14/19 176 lb (79.8 kg)  03/10/19 175 lb (79.4 kg)    Gen: Well NAD HEENT: EOMI,  MMM Lungs: Normal work of breathing. CTABL Heart: RRR no MRG Abd: NABS, Soft. Nondistended, Nontender Exts: Brisk capillary refill, warm and well perfused.  Left wrist and forearm erythematous maculopapular rash volar and dorsal forearm. Wrist is slightly swollen with no significant angulation or deformity.  Mildly tender palpation.  Pulses cap refill and sensation intact distally.        Lab and Radiology Results X-ray images left wrist obtained today personally and independently reviewed. Stable appearance of angulation of distal radius fracture with more callus formation and more healing compared to image on August 31 however still not healed. Await formal radiology review   Assessment and Plan: 68 y.o. female with rash: Likely contact dermatitis from EXOS cast.   Plan to transition to wrist brace to allow proper treatment of rash.  Treat with triamcinolone cream covered with arm sleeve.  Recheck in 1 week as noted below.  Fracture: Healing.  At this point the fracture is probably stable enough to transition away from true cast.  We will switch to wrist brace to allow for proper care of rash.  Recheck early next week.  Precautions reviewed.  PDMP not reviewed this encounter. Orders Placed This Encounter  Procedures   DG Wrist 2 Views Left    Standing Status:   Future    Number of Occurrences:   1    Standing Expiration Date:   06/10/2020    Order Specific Question:   Reason for Exam (SYMPTOM  OR DIAGNOSIS REQUIRED)    Answer:   follow up fracture    Order Specific Question:   Preferred imaging location?    [facility]    Order Specific Question:   Radiology Contrast Protocol - do NOT remove file path    Answer:   \[email or web address]   Meds ordered this encounter  Medications   triamcinolone cream (KENALOG) 0.5 %    Sig: Apply 1 application topically 2 (two) times daily. To affected areas until rash resolved.    Dispense:  60 g    Refill:  3  Historical information moved to improve visibility of documentation.  Past Medical History:  Diagnosis Date   Cancer ([facility])    skin cancer-leg   GERD (gastroesophageal reflux disease)    Hyperlipidemia    Hypertension    Impaired fasting glucose    Past Surgical History:  Procedure Laterality Date   bladder tack     CYSTOCELE REPAIR     PILONIDAL CYST EXCISION     VAGINAL HYSTERECTOMY     Social History   Tobacco Use   Smoking status: Former Smoker    Types: Cigarettes   Smokeless tobacco: Never Used  Substance Use Topics   Alcohol use: No   family history includes Asthma in her mother and another family member; Bell's palsy in her mother; Bipolar disorder in an other family member; Cancer in her father; Colon  polyps in her brother; Dementia in her mother; Depression in an other family member; Diabetes in her brother, father, and sister; Parkinsonism in her mother; Stomach cancer (age of onset: 1) in her father.  Medications: Current Outpatient Medications  Medication Sig Dispense Refill   alendronate (FOSAMAX) 70 MG tablet TAKE 1 TABLET WEEKLY TAKE ON EMPTY STOMACH WITH 8 OZ OF WATER(DO NOT LIE DOWN FOR 30 MINUTES) 12 tablet 11   Calcium Carbonate-Vit D-Min (CALCIUM 1200 PO) Take 2 capsules by mouth daily.     Cetirizine HCl (ZYRTEC ALLERGY) 10 MG CAPS Take 1 capsule by mouth daily.     FLUoxetine (PROZAC) 10 MG capsule TAKE ONE CAPSULE BY MOUTH DAILY. 90 capsule 2   hydrochlorothiazide (MICROZIDE) 12.5 MG capsule Take 1 capsule (12.5 mg total) by mouth every morning. 90 capsule 2   HYDROcodone-acetaminophen (NORCO/VICODIN) 5-325 MG tablet Take 1 tablet by mouth every 6 (six) hours as needed. 10 tablet 0   hydrocortisone 2.5 % cream Apply topically daily as needed. 30 g 1   ibuprofen (ADVIL,MOTRIN) 800 MG tablet Take 1 tablet (800 mg total) by mouth every 8 (eight) hours as needed. 60 tablet 0   Multiple Vitamins-Minerals (CENTRUM SILVER ADULT 50+ PO) Take 1 capsule by mouth daily.     ranitidine (ZANTAC) 150 MG tablet Take 150 mg by mouth daily.     simvastatin (ZOCOR) 40 MG tablet Take 1 tablet (40 mg total) by mouth at bedtime. 90 tablet 3   triamcinolone cream (KENALOG) 0.5 % Apply 1 application topically 2 (two) times daily. To affected areas until rash resolved. 60 g 3   No current facility-administered medications for this visit.    Allergies  Allergen Reactions   Diphenhydramine Hcl Swelling and Other (See Comments)   Codeine Nausea And Vomiting   Fish Oil Other (See Comments)    REACTION: vomiting.     Discussed warning signs or symptoms. Please see discharge instructions. Patient expresses understanding.

## 2019-04-11 NOTE — Patient Instructions (Signed)
Thank you for coming in today.  Apply the triamcinolone cream to the rash twice daily until it settles down and improves quite a bit.   Cover the cream with a sock or the sleeve I provided in clinic. Use the wrist brace. Recheck in 1 week.  Return sooner if needed.   Contact Dermatitis Dermatitis is redness, soreness, and swelling (inflammation) of the skin. Contact dermatitis is a reaction to something that touches the skin. There are two types of contact dermatitis:  Irritant contact dermatitis. This happens when something bothers (irritates) your skin, like soap.  Allergic contact dermatitis. This is caused when you are exposed to something that you are allergic to, such as poison ivy. What are the causes?  Common causes of irritant contact dermatitis include: ? Makeup. ? Soaps. ? Detergents. ? Bleaches. ? Acids. ? Metals, such as nickel.  Common causes of allergic contact dermatitis include: ? Plants. ? Chemicals. ? Jewelry. ? Latex. ? Medicines. ? Preservatives in products, such as clothing. What increases the risk?  Having a job that exposes you to things that bother your skin.  Having asthma or eczema. What are the signs or symptoms? Symptoms may happen anywhere the irritant has touched your skin. Symptoms include:  Dry or flaky skin.  Redness.  Cracks.  Itching.  Pain or a burning feeling.  Blisters.  Blood or clear fluid draining from skin cracks. With allergic contact dermatitis, swelling may occur. This may happen in places such as the eyelids, mouth, or genitals. How is this treated?  This condition is treated by checking for the cause of the reaction and protecting your skin. Treatment may also include: ? Steroid creams, ointments, or medicines. ? Antibiotic medicines or other ointments, if you have a skin infection. ? Lotion or medicines to help with itching. ? A bandage (dressing). Follow these instructions at home: Skin care  Moisturize  your skin as needed.  Put cool cloths on your skin.  Put a baking soda paste on your skin. Stir water into baking soda until it looks like a paste.  Do not scratch your skin.  Avoid having things rub up against your skin.  Avoid the use of soaps, perfumes, and dyes. Medicines  Take or apply over-the-counter and prescription medicines only as told by your doctor.  If you were prescribed an antibiotic medicine, take or apply it as told by your doctor. Do not stop using it even if your condition starts to get better. Bathing  Take a bath with: ? Epsom salts. ? Baking soda. ? Colloidal oatmeal.  Bathe less often.  Bathe in warm water. Avoid using hot water. Bandage care  If you were given a bandage, change it as told by your health care provider.  Wash your hands with soap and water before and after you change your bandage. If soap and water are not available, use hand sanitizer. General instructions  Avoid the things that caused your reaction. If you do not know what caused it, keep a journal. Write down: ? What you eat. ? What skin products you use. ? What you drink. ? What you wear in the area that has symptoms. This includes jewelry.  Check the affected areas every day for signs of infection. Check for: ? More redness, swelling, or pain. ? More fluid or blood. ? Warmth. ? Pus or a bad smell.  Keep all follow-up visits as told by your doctor. This is important. Contact a doctor if:  You do not get  better with treatment.  Your condition gets worse.  You have signs of infection, such as: ? More swelling. ? Tenderness. ? More redness. ? Soreness. ? Warmth.  You have a fever.  You have new symptoms. Get help right away if:  You have a very bad headache.  You have neck pain.  Your neck is stiff.  You throw up (vomit).  You feel very sleepy.  You see red streaks coming from the area.  Your bone or joint near the area hurts after the skin has  healed.  The area turns darker.  You have trouble breathing. Summary  Dermatitis is redness, soreness, and swelling of the skin.  Symptoms may occur where the irritant has touched you.  Treatment may include medicines and skin care.  If you do not know what caused your reaction, keep a journal.  Contact a doctor if your condition gets worse or you have signs of infection. This information is not intended to replace advice given to you by your health care provider. Make sure you discuss any questions you have with your health care provider. Document Released: 05/14/2009 Document Revised: 11/06/2018 Document Reviewed: 01/30/2018 Elsevier Patient Education  2020 [employer].

## 2019-04-14 ENCOUNTER — Ambulatory Visit: Payer: Medicare Other | Admitting: Family Medicine

## 2019-04-17 ENCOUNTER — Ambulatory Visit (INDEPENDENT_AMBULATORY_CARE_PROVIDER_SITE_OTHER): Payer: Medicare Other | Admitting: Family Medicine

## 2019-04-17 ENCOUNTER — Ambulatory Visit (INDEPENDENT_AMBULATORY_CARE_PROVIDER_SITE_OTHER): Payer: Medicare Other

## 2019-04-17 ENCOUNTER — Other Ambulatory Visit: Payer: Self-pay

## 2019-04-17 VITALS — BP 150/92 | HR 108 | Temp 98.4°F | Wt 173.0 lb

## 2019-04-17 DIAGNOSIS — S52532A Colles' fracture of left radius, initial encounter for closed fracture: Secondary | ICD-10-CM | POA: Diagnosis not present

## 2019-04-17 DIAGNOSIS — L2389 Allergic contact dermatitis due to other agents: Secondary | ICD-10-CM

## 2019-04-17 DIAGNOSIS — S52392D Other fracture of shaft of radius, left arm, subsequent encounter for closed fracture with routine healing: Secondary | ICD-10-CM | POA: Diagnosis not present

## 2019-04-17 NOTE — Patient Instructions (Addendum)
Thank you for coming in today. Continue the cream until rash is resolved (moslty).  Get xray now.  Assuming everything looks ok on XRay recheck in 4 weeks.  If you are able to arrive 30 mins prior to next appointment I can order xray ahead of time and you get the xray first.

## 2019-04-17 NOTE — Progress Notes (Signed)
Margaret [name] is a 68 y.o. female who presents to [facility] today for follow-up left wrist fracture and contact dermatitis.  Patient has been seen several times for fracture left wrist.  She was transitioned to a short arm EXOS cast on August 31 and seen for follow-up on September 11.  At that time she had a significant rash in the distribution of the cast on her left forearm.  She was thought to have contact dermatitis and treated with topical triamcinolone cream.  Additionally she was switched to a Velcro wrist brace and stockinette liner.  She has this is worked quite well.  Her rash has significantly improved with triamcinolone cream.  She notes her wrist is not painful and the brace seems to be immobilizing her wrist quite nicely.  ROS:  As above  Exam:  BP (!) 150/92    Pulse (!) 108    Temp 98.4 F (36.9 C) (Oral)    Wt 173 lb (78.5 kg)    SpO2 97%    BMI 27.10 kg/m   Wt Readings from Last 5 Encounters:  04/17/19 173 lb (78.5 kg)  04/11/19 174 lb 6.4 oz (79.1 kg)  03/31/19 175 lb (79.4 kg)  03/17/19 175 lb 11.2 oz (79.7 kg)  03/14/19 176 lb (79.8 kg)   General: Well Developed, well nourished, and in no acute distress.  Neuro/Psych: Alert and oriented x3, extra-ocular muscles intact, able to move all 4 extremities, sensation grossly intact. Skin: Warm and dry, Left arm significant improvement in rash with significant decreased erythema.  No papules.  Nontender no induration or fluctuance.  No discharge. Respiratory: Not using accessory muscles, speaking in full sentences, trachea midline.  Cardiovascular: Pulses palpable, no extremity edema. Abdomen: Does not appear distended. MSK: Left wrist no significant swelling or severe deformity.  Decreased wrist motion.  Not particularly tender.    Lab and Radiology Results X-ray images left wrist obtained today personally independently reviewed. No change in alignment compared to  x-rays obtained September 11. Healing present but not fully healed.  Fracture line still evident. Await formal radiology review    Assessment and Plan: 68 y.o. female with rash left arm.  Significant improvement.  Plan to continue triamcinolone cream and topical care. Recheck 4 weeks or sooner if needed.  Fracture left wrist: Stable with wrist brace today.  If doing well clinically plan to recheck in 4 weeks.  Return sooner if needed.   PDMP not reviewed this encounter. Orders Placed This Encounter  Procedures   DG Wrist 2 Views Left    Standing Status:   Future    Number of Occurrences:   1    Standing Expiration Date:   06/16/2020    Order Specific Question:   Reason for Exam (SYMPTOM  OR DIAGNOSIS REQUIRED)    Answer:   follow up fracture    Order Specific Question:   Preferred imaging location?    [facility]    Order Specific Question:   Radiology Contrast Protocol - do NOT remove file path    Answer:   \[email or web address]   No orders of the defined types were placed in this encounter.   Historical information moved to improve visibility of documentation.  Past Medical History:  Diagnosis Date   Cancer ([facility])    skin cancer-leg   GERD (gastroesophageal reflux disease)    Hyperlipidemia    Hypertension    Impaired fasting glucose    Past Surgical  History:  Procedure Laterality Date   bladder tack     CYSTOCELE REPAIR     PILONIDAL CYST EXCISION     VAGINAL HYSTERECTOMY     Social History   Tobacco Use   Smoking status: Former Smoker    Types: Cigarettes   Smokeless tobacco: Never Used  Substance Use Topics   Alcohol use: No   family history includes Asthma in her mother and another family member; Bell's palsy in her mother; Bipolar disorder in an other family member; Cancer in her father; Colon polyps in her brother; Dementia in her mother; Depression in an other family member; Diabetes  in her brother, father, and sister; Parkinsonism in her mother; Stomach cancer (age of onset: 49) in her father.  Medications: Current Outpatient Medications  Medication Sig Dispense Refill   alendronate (FOSAMAX) 70 MG tablet TAKE 1 TABLET WEEKLY TAKE ON EMPTY STOMACH WITH 8 OZ OF WATER(DO NOT LIE DOWN FOR 30 MINUTES) 12 tablet 11   Calcium Carbonate-Vit D-Min (CALCIUM 1200 PO) Take 2 capsules by mouth daily.     Cetirizine HCl (ZYRTEC ALLERGY) 10 MG CAPS Take 1 capsule by mouth daily.     FLUoxetine (PROZAC) 10 MG capsule TAKE ONE CAPSULE BY MOUTH DAILY. 90 capsule 2   hydrochlorothiazide (MICROZIDE) 12.5 MG capsule Take 1 capsule (12.5 mg total) by mouth every morning. 90 capsule 2   HYDROcodone-acetaminophen (NORCO/VICODIN) 5-325 MG tablet Take 1 tablet by mouth every 6 (six) hours as needed. 10 tablet 0   hydrocortisone 2.5 % cream Apply topically daily as needed. 30 g 1   ibuprofen (ADVIL,MOTRIN) 800 MG tablet Take 1 tablet (800 mg total) by mouth every 8 (eight) hours as needed. 60 tablet 0   Multiple Vitamins-Minerals (CENTRUM SILVER ADULT 50+ PO) Take 1 capsule by mouth daily.     ranitidine (ZANTAC) 150 MG tablet Take 150 mg by mouth daily.     simvastatin (ZOCOR) 40 MG tablet Take 1 tablet (40 mg total) by mouth at bedtime. 90 tablet 3   triamcinolone cream (KENALOG) 0.5 % Apply 1 application topically 2 (two) times daily. To affected areas until rash resolved. 60 g 3   No current facility-administered medications for this visit.    Allergies  Allergen Reactions   Diphenhydramine Hcl Swelling and Other (See Comments)   Codeine Nausea And Vomiting   Fish Oil Other (See Comments)    REACTION: vomiting.      Discussed warning signs or symptoms. Please see discharge instructions. Patient expresses understanding.

## 2019-05-13 ENCOUNTER — Other Ambulatory Visit: Payer: Self-pay | Admitting: Family Medicine

## 2019-05-15 ENCOUNTER — Other Ambulatory Visit: Payer: Self-pay

## 2019-05-15 ENCOUNTER — Ambulatory Visit (INDEPENDENT_AMBULATORY_CARE_PROVIDER_SITE_OTHER): Payer: Medicare Other

## 2019-05-15 ENCOUNTER — Ambulatory Visit (INDEPENDENT_AMBULATORY_CARE_PROVIDER_SITE_OTHER): Payer: Medicare Other | Admitting: Family Medicine

## 2019-05-15 ENCOUNTER — Encounter: Payer: Self-pay | Admitting: Family Medicine

## 2019-05-15 VITALS — BP 147/99 | HR 94 | Temp 98.1°F | Wt 173.0 lb

## 2019-05-15 DIAGNOSIS — S52502D Unspecified fracture of the lower end of left radius, subsequent encounter for closed fracture with routine healing: Secondary | ICD-10-CM | POA: Diagnosis not present

## 2019-05-15 DIAGNOSIS — S52532A Colles' fracture of left radius, initial encounter for closed fracture: Secondary | ICD-10-CM | POA: Diagnosis not present

## 2019-05-15 NOTE — Progress Notes (Signed)
Margaret [name] is a 68 y.o. female who presents to [name] today for follow-up left wrist fracture. Patient suffered a left distal radius fracture on August 10.  She was treated with immobilization.  At her last visit with me on September 17 she had been transition to a wrist brace.  She had trial of EXOS casting but had significant contact dermatitis due to that and due to skin hygiene reasons was using a rigid wrist brace.  She clinically was doing pretty well but did not have much callus formation and bone healing on subsequent x-ray.  In the interim she has done pretty well.  She denies significant soreness into the wrist.  She has she still is having some skin irritation that is manageable with triamcinolone.   ROS:  As above  Exam:  BP (!) 147/99   Pulse 94   Temp 98.1 F (36.7 C) (Oral)   Wt 173 lb (78.5 kg)   BMI 27.10 kg/m  Wt Readings from Last 5 Encounters:  05/15/19 173 lb (78.5 kg)  04/17/19 173 lb (78.5 kg)  04/11/19 174 lb 6.4 oz (79.1 kg)  03/31/19 175 lb (79.4 kg)  03/17/19 175 lb 11.2 oz (79.7 kg)   General: Well Developed, well nourished, and in no acute distress.  Neuro/Psych: Alert and oriented x3, extra-ocular muscles intact, able to move all 4 extremities, sensation grossly intact. Respiratory: Not using accessory muscles, speaking in full sentences, trachea midline.  Cardiovascular: Pulses palpable, no extremity edema. Abdomen: Does not appear distended. MSK: Left wrist no significant swelling or deformity.  Minimally tender distal radius.  Decreased motion.  Pulses cap refill and sensation are intact.  Skin: Warm and dry, mild erythema dorsal and volar wrist and location of brace.  Nontender.  No drainage.   Lab and Radiology Results X-ray images left wrist obtained today personally and independently reviewed. No significant progression of fracture healing compared to prior x-rays dated September 17.   Continued fracture line present.  Slight callus formation present. Await formal radiology review    Assessment and Plan: 68 y.o. female with left wrist fracture.  Fracture now present for about 2 months without significant bony healing on x-ray.  Plan to switch from typical wrist brace to a thumb spica wrist brace which should provide more wrist stability.  Given her continued skin symptoms I have very reluctant to consider casting as I think it will cause significant difficulty with her skin. Discussed the possibility of nonunion if not better at the 5-month mark.  Next step from nonunion would be bone stimulator if possible.  Also discussed the possibility of a second opinion with orthopedic [name].  She like to continue care with me and will think about that the future if needed.  I am transitioning to sports medicine in [place].  I offered her follow-up with Dr. Dianah Field in clinic and she would like to follow-up with me in about 4 weeks.  We will schedule in [place] with me on November 12.  We will get x-ray here in [place] ahead of time.   PDMP not reviewed this encounter. Orders Placed This Encounter  Procedures  . DG Wrist Complete Left    Standing Status:   Future    Number of Occurrences:   1    Standing Expiration Date:   07/14/2020    Order Specific Question:   Reason for Exam (SYMPTOM  OR DIAGNOSIS REQUIRED)    Answer:   f/u fx  Order Specific Question:   Preferred imaging location?    [facility]    Order Specific Question:   Radiology Contrast Protocol - do NOT remove file path    Answer:   \\[email or web address]  . DG Wrist Complete Left    Standing Status:   Future    Standing Expiration Date:   07/14/2020    Order Specific Question:   Reason for Exam (SYMPTOM  OR DIAGNOSIS REQUIRED)    Answer:   follow up fracture    Order Specific Question:   Preferred imaging location?    [facility]    Order Specific Question:   Radiology Contrast Protocol - do NOT remove file path    Answer:   \\[email or web address]   No orders of the defined types were placed in this encounter.   Historical information moved to improve visibility of documentation.  Past Medical History:  Diagnosis Date  . Cancer ([facility])    skin cancer-leg  . GERD (gastroesophageal reflux disease)   . Hyperlipidemia   . Hypertension   . Impaired fasting glucose    Past Surgical History:  Procedure Laterality Date  . bladder tack    . CYSTOCELE REPAIR    . PILONIDAL CYST EXCISION    . VAGINAL HYSTERECTOMY     Social History   Tobacco Use  . Smoking status: Former Smoker    Types: Cigarettes  . Smokeless tobacco: Never Used  Substance Use Topics  . Alcohol use: No   family history includes Asthma in her mother and another family member; Bell's palsy in her mother; Bipolar disorder in an other family member; Cancer in her father; Colon polyps in her brother; Dementia in her mother; Depression in an other family member; Diabetes in her brother, father, and sister; Parkinsonism in her mother; Stomach cancer (age of onset: 32) in her father.  Medications: Current Outpatient Medications  Medication Sig Dispense Refill  . alendronate (FOSAMAX) 70 MG tablet TAKE 1 TABLET WEEKLY TAKE ON EMPTY STOMACH WITH 8 OZ OF WATER(DO NOT LIE DOWN FOR 30 MINUTES) 12 tablet 11  . Calcium Carbonate-Vit D-Min (CALCIUM 1200 PO) Take 2 capsules by mouth daily.    . Cetirizine HCl (ZYRTEC ALLERGY) 10 MG CAPS Take 1 capsule by mouth daily.    Marland Kitchen FLUoxetine (PROZAC) 10 MG capsule TAKE ONE CAPSULE BY MOUTH DAILY 90 capsule 2  . hydrochlorothiazide (MICROZIDE) 12.5 MG capsule Take 1 capsule (12.5 mg total) by mouth every morning. 90 capsule 2  . HYDROcodone-acetaminophen (NORCO/VICODIN) 5-325 MG tablet Take 1 tablet by mouth every 6 (six) hours as needed. 10 tablet 0  . hydrocortisone  2.5 % cream Apply topically daily as needed. 30 g 1  . ibuprofen (ADVIL,MOTRIN) 800 MG tablet Take 1 tablet (800 mg total) by mouth every 8 (eight) hours as needed. 60 tablet 0  . Multiple Vitamins-Minerals (CENTRUM SILVER ADULT 50+ PO) Take 1 capsule by mouth daily.    . ranitidine (ZANTAC) 150 MG tablet Take 150 mg by mouth daily.    . simvastatin (ZOCOR) 40 MG tablet Take 1 tablet (40 mg total) by mouth at bedtime. 90 tablet 3  . triamcinolone cream (KENALOG) 0.5 % Apply 1 application topically 2 (two) times daily. To affected areas until rash resolved. 60 g 3   No current facility-administered medications for this visit.    Allergies  Allergen Reactions  . Diphenhydramine Hcl Swelling and Other (See Comments)  . Codeine Nausea And Vomiting  .  Fish Oil Other (See Comments)    REACTION: vomiting.      Discussed warning signs or symptoms. Please see discharge instructions. Patient expresses understanding.

## 2019-05-15 NOTE — Patient Instructions (Signed)
Thank you for coming in today. Use the thumb spica wrist brace Continue activity.  Get xray the week of Nov 12.  Follow up with me at [facility] in [place] on Nov 12.  Let me know if you have any issues.  Recheck as needed sooner.

## 2019-06-10 ENCOUNTER — Other Ambulatory Visit: Payer: Self-pay

## 2019-06-10 ENCOUNTER — Ambulatory Visit (INDEPENDENT_AMBULATORY_CARE_PROVIDER_SITE_OTHER): Payer: Medicare Other

## 2019-06-10 DIAGNOSIS — S52532A Colles' fracture of left radius, initial encounter for closed fracture: Secondary | ICD-10-CM

## 2019-06-10 DIAGNOSIS — S52502D Unspecified fracture of the lower end of left radius, subsequent encounter for closed fracture with routine healing: Secondary | ICD-10-CM | POA: Diagnosis not present

## 2019-06-10 NOTE — Progress Notes (Signed)
X-ray of wrist shows improved healing compared to 3 weeks ago but still not fully healed.  You have a scheduled appointment with Dr. Darene Lamer in [place] on November 12.  That would be reasonable to keep that appointment.  Alternatively you could schedule with me at my temporary office [place]  [address] [phone]

## 2019-06-12 ENCOUNTER — Ambulatory Visit (INDEPENDENT_AMBULATORY_CARE_PROVIDER_SITE_OTHER): Payer: Medicare Other | Admitting: Sports Medicine

## 2019-06-12 ENCOUNTER — Other Ambulatory Visit: Payer: Self-pay

## 2019-06-12 ENCOUNTER — Encounter: Payer: Self-pay | Admitting: Sports Medicine

## 2019-06-12 ENCOUNTER — Ambulatory Visit (INDEPENDENT_AMBULATORY_CARE_PROVIDER_SITE_OTHER): Payer: Medicare Other

## 2019-06-12 ENCOUNTER — Ambulatory Visit: Payer: Medicare Other | Admitting: Family Medicine

## 2019-06-12 DIAGNOSIS — S52532S Colles' fracture of left radius, sequela: Secondary | ICD-10-CM

## 2019-06-12 DIAGNOSIS — S5292XA Unspecified fracture of left forearm, initial encounter for closed fracture: Secondary | ICD-10-CM | POA: Insufficient documentation

## 2019-06-12 DIAGNOSIS — S52592D Other fractures of lower end of left radius, subsequent encounter for closed fracture with routine healing: Secondary | ICD-10-CM | POA: Diagnosis not present

## 2019-06-12 MED ORDER — MELOXICAM 15 MG PO TABS: ORAL_TABLET | ORAL | 0 refills | Status: DC

## 2019-06-12 NOTE — Assessment & Plan Note (Addendum)
Patient is transitioning care from Dr. Georgina Snell. Closed left distal radius fracture August 10, we are now 3 months out and she still has pain. 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 She is significantly atrophied, we will see if we can get this muscle mass to return. Return to see me in a month.

## 2019-06-12 NOTE — Progress Notes (Signed)
Subjective:    CC: Follow-up  HPI: Margaret [name] returns, she is a pleasant 68 year old female with a left distal radius fracture sustained approximately 3 months ago.  Unfortunately she continues to have pain, weakness.  I reviewed the past medical history, family history, social history, surgical history, and allergies today and no changes were needed.  Please see the problem list section below in epic for further details.  Past Medical History: Past Medical History:  Diagnosis Date  . Cancer (HCC)    skin cancer-leg  . GERD (gastroesophageal reflux disease)   . Hyperlipidemia   . Hypertension   . Impaired fasting glucose    Past Surgical History: Past Surgical History:  Procedure Laterality Date  . bladder tack    . CYSTOCELE REPAIR    . PILONIDAL CYST EXCISION    . VAGINAL HYSTERECTOMY     Social History: Social History   Socioeconomic History  . Marital status: Single    Spouse name: Not on file  . Number of children: Not on file  . Years of education: Not on file  . Highest education level: Not on file  Occupational History  . Not on file  Social Needs  . Financial resource strain: Not on file  . Food insecurity    Worry: Not on file    Inability: Not on file  . Transportation needs    Medical: Not on file    Non-medical: Not on file  Tobacco Use  . Smoking status: Former Smoker    Types: Cigarettes  . Smokeless tobacco: Never Used  Substance and Sexual Activity  . Alcohol use: No  . Drug use: No  . Sexual activity: Not on file  Lifestyle  . Physical activity    Days per week: Not on file    Minutes per session: Not on file  . Stress: Not on file  Relationships  . Social Herbalist on phone: Not on file    Gets together: Not on file    Attends religious service: Not on file    Active member of club or organization: Not on file    Attends meetings of clubs or organizations: Not on file    Relationship status: Not on file  Other Topics  Concern  . Not on file  Social History Narrative   Reviewed history from 10/13/2009 and no changes required.   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    Drug use-no   Regular exercise-no, at work   Diet: "graze during the day" no fruit, lots of veggies   Family History: Family History  Problem Relation Age of Onset  . Stomach cancer Father 95       deceased  . Diabetes Father   . Cancer Father        stomach  . Bell's palsy Mother   . Parkinsonism Mother        died age 84  . Dementia Mother   . Asthma Mother   . Asthma Other   . Depression Other   . Bipolar disorder Other   . Diabetes Sister   . Diabetes Brother   . Colon polyps Brother   . Colon cancer Neg Hx   . Esophageal cancer Neg Hx    Allergies: Allergies  Allergen Reactions  . Diphenhydramine Hcl Swelling and Other (See Comments)  . Codeine Nausea And Vomiting  . Fish Oil  Other (See Comments)    REACTION: vomiting.   Medications: See med rec.  Review of Systems: No fevers, chills, night sweats, weight loss, chest pain, or shortness of breath.   Objective:    General: Well Developed, well nourished, and in no acute distress.  Neuro: Alert and oriented x3, extra-ocular muscles intact, sensation grossly intact.  HEENT: Normocephalic, atraumatic, pupils equal round reactive to light, neck supple, no masses, no lymphadenopathy, thyroid nonpalpable.  Skin: Warm and dry, no rashes. Cardiac: Regular rate and rhythm, no murmurs rubs or gallops, no lower extremity edema.  Respiratory: Clear to auscultation bilaterally. Not using accessory muscles, speaking in full sentences. Left wrist: Atrophied, tender to palpation slightly over the dorsal distal radius, loss of range of motion as expected.  Neurovascular intact distally.  Impression and Recommendations:    Fracture of radius, left, closed Patient is transitioning care from  Dr. Georgina Snell. Closed left distal radius fracture August 10, we are now 3 months out and she still has pain. 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 She is significantly atrophied, we will see if we can get this muscle mass to return. Return to see me in a month.   ___________________________________________ Gwen Her. Dianah Field, M.D., ABFM., CAQSM. Primary Care and Sports Medicine [facility]  Adjunct Professor of [employer] [facility] [employer]

## 2019-06-24 ENCOUNTER — Ambulatory Visit: Payer: Medicare Other | Admitting: Rehabilitative and Restorative Service Providers"

## 2019-07-02 ENCOUNTER — Ambulatory Visit (INDEPENDENT_AMBULATORY_CARE_PROVIDER_SITE_OTHER): Payer: Medicare Other | Admitting: Physical Therapy

## 2019-07-02 ENCOUNTER — Other Ambulatory Visit: Payer: Self-pay

## 2019-07-02 ENCOUNTER — Encounter: Payer: Self-pay | Admitting: Physical Therapy

## 2019-07-02 ENCOUNTER — Ambulatory Visit (INDEPENDENT_AMBULATORY_CARE_PROVIDER_SITE_OTHER): Payer: Medicare Other | Admitting: Physician Assistant

## 2019-07-02 DIAGNOSIS — M6281 Muscle weakness (generalized): Secondary | ICD-10-CM

## 2019-07-02 DIAGNOSIS — M25632 Stiffness of left wrist, not elsewhere classified: Secondary | ICD-10-CM | POA: Diagnosis present

## 2019-07-02 DIAGNOSIS — M25532 Pain in left wrist: Secondary | ICD-10-CM | POA: Diagnosis not present

## 2019-07-02 DIAGNOSIS — Z5329 Procedure and treatment not carried out because of patient's decision for other reasons: Secondary | ICD-10-CM

## 2019-07-02 NOTE — Therapy (Addendum)
[place] [address] [place], [place], [address] Phone: [phone]   Fax:  [phone]  Physical Therapy Evaluation and Discharge Summary  Patient Details  Name: Margaret [name] MRN: [id number] Date of Birth: 08-17-1950 Referring Provider (PT): Dr. Dianah Field   Encounter Date: 07/02/2019  PT End of Session - 07/02/19 0933    Visit Number  1    Number of Visits  12    Date for PT Re-Evaluation  08/13/19    PT Start Time  0934    PT Stop Time  1013    PT Time Calculation (min)  39 min    Activity Tolerance  Patient tolerated treatment well    Behavior During Therapy  [facility] for tasks assessed/performed       Past Medical History:  Diagnosis Date  . Cancer (HCC)    skin cancer-leg  . GERD (gastroesophageal reflux disease)   . Hyperlipidemia   . Hypertension   . Impaired fasting glucose     Past Surgical History:  Procedure Laterality Date  . bladder tack    . CYSTOCELE REPAIR    . PILONIDAL CYST EXCISION    . VAGINAL HYSTERECTOMY      There were no vitals filed for this visit.   Subjective Assessment - 07/02/19 0937    Subjective  Patient fell and fractured her left radius 03/10/19 after stumbling over a dog. She is still having some pain daily and gets some cramping in lateral forearm. She has burning feeling on ant distal arm. She's been doing some hand exericises.    Pertinent History  HTN, osteopenia, CKD, hysterectomy, depression    Diagnostic tests  CT scan (see chart)  not completely healed as of Nov 10 xray "Fracture lucency still appreciable."    Patient Stated Goals  to have better motion and less pain and know my boundaries with it    Currently in Pain?  Yes    Pain Score  3     Pain Location  Wrist    Pain Orientation  Left    Pain Descriptors / Indicators  Aching    Pain Type  Acute pain    Pain Onset  More than a month ago    Pain Frequency  Intermittent    Aggravating Factors    overuse    Pain Relieving Factors  rest    Effect of Pain on Daily Activities  some pain; she cooks for her family and her daughter's family         [facility] PT Assessment - 07/02/19 0001      Assessment   Medical Diagnosis  left Colles fracture    Referring Provider (PT)  Dr. Dianah Field    Onset Date/Surgical Date  03/10/19    Hand Dominance  Right    Next MD Visit  2 weeks    Prior Therapy  no      Precautions   Precautions  None      Restrictions   Weight Bearing Restrictions  No      Balance Screen   Has the patient fallen in the past 6 months  Yes    How many times?  1    Has the patient had a decrease in activity level because of a fear of falling?   No    Is the patient reluctant to leave their home because of a fear of falling?   No      Home Environment   Living  Environment  Private residence    Additional Comments  cooks for family      Prior Function   Level of [place]  Retired    Leisure  cooking      ROM / Strength   AROM / PROM / Strength  AROM;PROM;Strength      AROM   AROM Assessment Site  Forearm;Wrist    Right/Left Forearm  Left    Left Forearm Pronation  60 Degrees    Left Forearm Supination  82 Degrees    Right/Left Wrist  Left    Left Wrist Extension  54 Degrees    Left Wrist Flexion  44 Degrees    Left Wrist Radial Deviation  18 Degrees    Left Wrist Ulnar Deviation  27 Degrees      PROM   PROM Assessment Site  Wrist    Right/Left Wrist  Left    Left Wrist Extension  70 Degrees    Left Wrist Flexion  72 Degrees    Left Wrist Radial Deviation  30 Degrees    Left Wrist Ulnar Deviation  37 Degrees      Strength   Strength Assessment Site  Forearm;Wrist;Hand    Right/Left Forearm  Left    Left Forearm Pronation  5/5    Left Forearm Supination  5/5    Right/Left Wrist  Left    Left Wrist Flexion  4+/5   in available range   Left Wrist Extension  5/5   in available range   Left Wrist Radial Deviation   5/5    Left Wrist Ulnar Deviation  5/5    Right/Left hand  Right;Left    Right Hand Grip (lbs)  82/78/72    Left Hand Grip (lbs)  38/39/39      Palpation   Palpation comment  unremarkable                Objective measurements completed on examination: See above findings.              PT Education - 07/02/19 1312    Education Details  HEP    Person(s) Educated  Patient    Methods  Explanation;Demonstration;Handout    Comprehension  Verbalized understanding;Returned demonstration          PT Long Term Goals - 07/02/19 1319      PT LONG TERM GOAL #1   Title  Ind with HEP for ROM and strengthening    Time  6    Period  Weeks    Status  New    Target Date  08/13/19      PT LONG TERM GOAL #2   Title  Improved left wrist ROM to [facility] to perform ADLS with pain 0/17 or less.    Time  6    Period  Weeks    Status  New      PT LONG TERM GOAL #3   Title  Increased grip strength in left hand by >= 49 # to improve function.    Time  6    Period  Weeks    Status  New      PT LONG TERM GOAL #4   Title  Patient to demo 5/5 left wrist strength.    Time  6    Period  Weeks    Status  New             Plan - 07/02/19 1313  Clinical Impression Statement  Patient presents s/p left Colles fracture to radius on 03/10/19. She is experiencing some pain with overuse and a burning sensation in left distal foream secondary to cast. She has limited AROM and strength in her left wrist and also has significant grip strength deficit compared to right hand. These deficits limit her cooking which she does for her family and her daughter's family. She will benefit from PT to address these deficits and return her to her PLOF.    Personal Factors and Comorbidities  Comorbidity 3+    Comorbidities  HTN, osteopenia, CKD, hysterectomy, depression    Examination-Participation Restrictions  Meal Prep    Stability/Clinical Decision Making  Stable/Uncomplicated    Clinical  Decision Making  Low    Rehab Potential  Excellent    PT Frequency  1x / week   per pt request   PT Duration  6 weeks    PT Treatment/Interventions  ADLs/Self Care Home Management;Cryotherapy;Electrical Stimulation;Iontophoresis 40m/ml Dexamethasone;Moist Heat;Therapeutic exercise;Patient/family education;Passive range of motion;Dry needling;Manual techniques;Taping    PT Next Visit Plan  Review HEP, wrist ROM, wrist, forearm and hand strengthening (aggressive per order)    PT Home Exercise Plan  [id number]    Consulted and Agree with Plan of Care  Patient       Patient will benefit from skilled therapeutic intervention in order to improve the following deficits and impairments:  Decreased range of motion, Pain, Impaired flexibility, Decreased strength  Visit Diagnosis: Stiffness of left wrist, not elsewhere classified - Plan: PT plan of care cert/re-cert  Pain in left wrist - Plan: PT plan of care cert/re-cert  Muscle weakness (generalized) - Plan: PT plan of care cert/re-cert     Problem List Patient Active Problem List   Diagnosis Date Noted  . Fracture of radius, left, closed 06/12/2019  . Osteopenia 09/04/2018  . CKD (chronic kidney disease) stage 3, GFR 30-59 ml/min 12/30/2017  . Rib pain on left side 07/17/2017  . History of basal cell carcinoma of skin 03/14/2016  . Cataract 04/26/2015  . Dry eye syndrome 04/26/2015  . Hemorrhoids 08/19/2013  . 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 Depression, recurrent ([place] 03/17/2010  . Vitamin D deficiency 11/15/2009  . ALLERGIC RHINITIS 11/02/2009  . 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 POLYARTHRITIS 04/08/2008  . GERD 03/04/2007  . HERNIATED DISC 03/04/2007  . Osteoporosis 03/04/2007  . Hyperlipidemia 01/04/2007  . Essential hypertension 01/04/2007  . IBS 01/04/2007  . ROSACEA 01/04/2007   JMadelyn FlavorsPT 07/02/2019, 1:30 PM  [facility] [address]Phone: [phone]  Fax:  [phone] Name: Margaret BRASSMRN: [id number] of Birth: [birth year]  PHYSICAL THERAPY DISCHARGE SUMMARY  Visits from Start of Care: 1  Current functional level related to goals / functional outcomes: See above   Remaining deficits: See above   Education / Equipment: HEP Plan: Patient agrees to discharge.  Patient goals were not met. Patient is being discharged due to not returning since the last visit.  ?????    JMadelyn Flavors PT 08/18/19 10:19 AM  [facility] [place] [address] [phone](office) [phone](fax)

## 2019-07-02 NOTE — Patient Instructions (Signed)
Access Code: [email or web address]  URL: [email or web address]  Date: 07/02/2019  Prepared by: Almyra Free Jozlin Bently   Exercises Standing Wrist Extension Stretch - 3 reps - 1 sets - 30 sec hold - 2x daily - 7x weekly Standing Wrist Flexion Stretch - 3 reps - 1 sets - 30 sec hold - 2x daily - 7x weekly Seated Wrist Extension PROM - 3 reps - 1 sets - 30-60 sec hold - 2x daily - 7x weekly

## 2019-07-02 NOTE — Progress Notes (Signed)
No show

## 2019-07-09 ENCOUNTER — Encounter: Payer: Medicare Other | Admitting: Physical Therapy

## 2019-07-10 ENCOUNTER — Ambulatory Visit: Payer: Medicare Other | Admitting: Sports Medicine

## 2019-11-11 ENCOUNTER — Other Ambulatory Visit: Payer: Self-pay | Admitting: *Deleted

## 2019-11-11 DIAGNOSIS — M810 Age-related osteoporosis without current pathological fracture: Secondary | ICD-10-CM

## 2019-11-11 MED ORDER — FLUOXETINE HCL 10 MG PO CAPS: 10.0000 mg | ORAL_CAPSULE | Freq: Every day | ORAL | 2 refills | Status: DC

## 2019-11-11 MED ORDER — ALENDRONATE SODIUM 70 MG PO TABS: ORAL_TABLET | ORAL | 11 refills | Status: DC

## 2020-01-07 ENCOUNTER — Other Ambulatory Visit: Payer: Self-pay | Admitting: *Deleted

## 2020-01-07 MED ORDER — HYDROCHLOROTHIAZIDE 12.5 MG PO CAPS: 12.5000 mg | ORAL_CAPSULE | Freq: Every morning | ORAL | 2 refills | Status: DC

## 2020-01-27 ENCOUNTER — Other Ambulatory Visit: Payer: Self-pay

## 2020-01-27 ENCOUNTER — Encounter: Payer: Self-pay | Admitting: Nurse Practitioner

## 2020-01-27 ENCOUNTER — Ambulatory Visit (INDEPENDENT_AMBULATORY_CARE_PROVIDER_SITE_OTHER): Payer: Medicare Other | Admitting: Nurse Practitioner

## 2020-01-27 VITALS — BP 143/95 | HR 84 | Temp 98.2°F | Ht 67.0 in | Wt 164.5 lb

## 2020-01-27 DIAGNOSIS — H60502 Unspecified acute noninfective otitis externa, left ear: Secondary | ICD-10-CM

## 2020-01-27 MED ORDER — NEOMYCIN-POLYMYXIN-HC 3.5-10000-1 OT SOLN: 4.0000 [drp] | Freq: Four times a day (QID) | OTIC | 0 refills | Status: AC

## 2020-01-27 NOTE — Patient Instructions (Signed)
Place 4 drops of medication into the left ear 4 times a day (about every 6 hours) and keep the head tilted while the medication is in place. After about 3-5 minutes, you may place a cotton ball at the opening of the ear to keep the medication in place.   If you begin to experience worsening pain, fever, chills, or drainage from the ear, please let us know.   If your symptoms do not improve, or improve and then return, please let us know.    Otitis Externa  Otitis externa is an infection of the outer ear canal. The outer ear canal is the area between the outside of the ear and the eardrum. Otitis externa is sometimes called swimmer's ear. What are the causes? Common causes of this condition include:  Swimming in dirty water.  Moisture in the ear.  An injury to the inside of the ear.  An object stuck in the ear.  A cut or scrape on the outside of the ear. What increases the risk? You are more likely to get this condition if you go swimming often. What are the signs or symptoms?  Itching in the ear. This is often the first symptom.  Swelling of the ear.  Redness in the ear.  Ear pain. The pain may get worse when you pull on your ear.  Pus coming from the ear. How is this treated? This condition may be treated with:  Antibiotic ear drops. These are often given for 10-14 days.  Medicines to reduce itching and swelling. Follow these instructions at home:  If you were given antibiotic ear drops, use them as told by your doctor. Do not stop using them even if your condition gets better.  Take over-the-counter and prescription medicines only as told by your doctor.  Avoid getting water in your ears as told by your doctor. You may be told to avoid swimming or water sports for a few days.  Keep all follow-up visits as told by your doctor. This is important. How is this prevented?  Keep your ears dry. Use the corner of a towel to dry your ears after you swim or bathe.  Try  not to scratch or put things in your ear. Doing these things makes it easier for germs to grow in your ear.  Avoid swimming in lakes, dirty water, or pools that may not have the right amount of a chemical called chlorine. Contact a doctor if:  You have a fever.  Your ear is still red, swollen, or painful after 3 days.  You still have pus coming from your ear after 3 days.  Your redness, swelling, or pain gets worse.  You have a really bad headache.  You have redness, swelling, pain, or tenderness behind your ear. Summary  Otitis externa is an infection of the outer ear canal.  Symptoms include pain, redness, and swelling of the ear.  If you were given antibiotic ear drops, use them as told by your doctor. Do not stop using them even if your condition gets better.  Try not to scratch or put things in your ear. This information is not intended to replace advice given to you by your health care provider. Make sure you discuss any questions you have with your health care provider. Document Revised: 12/21/2017 Document Reviewed: 12/21/2017 Elsevier Patient Education  [place].

## 2020-01-27 NOTE — Progress Notes (Signed)
Acute Office Visit  Subjective:    Patient ID: Margaret [name], female    DOB: [birth year], 69 y.o.   MRN: [id number]  Chief Complaint  Patient presents with  . Otalgia    left ear, getting closed up, feels infected, put a q-tip in her ear last night and withdrew with blood on the end, eakes up every morning and it feels like it is closing up more    HPI Patient is in today for fullness and inflammation in her left ear that started over the weekend. She reports that she noticed that her ear felt full and her hearing was decreased in the left ear. Her son looked inside the ear and told her the ear canal was swelling. She did try to clean out the ear canal yesterday with a q-tip and noticed blood on the end of the q-tip. She reports the inflammation is worsening.  She denies drainage from the ear or significant pain. She denies sinus pain or pressure, cough, sore throat, fever, or chills. She denies any known sources of water in the ear with the exception of shower water.   Past Medical History:  Diagnosis Date  . Cancer (HCC)    skin cancer-leg  . GERD (gastroesophageal reflux disease)   . Hyperlipidemia   . Hypertension   . Impaired fasting glucose     Past Surgical History:  Procedure Laterality Date  . bladder tack    . CYSTOCELE REPAIR    . PILONIDAL CYST EXCISION    . VAGINAL HYSTERECTOMY      Family History  Problem Relation Age of Onset  . Stomach cancer Father 68       deceased  . Diabetes Father   . Cancer Father        stomach  . Bell's palsy Mother   . Parkinsonism Mother        died age 20  . Dementia Mother   . Asthma Mother   . Asthma Other   . Depression Other   . Bipolar disorder Other   . Diabetes Sister   . Diabetes Brother   . Colon polyps Brother   . Colon cancer Neg Hx   . Esophageal cancer Neg Hx     Social History   Socioeconomic History  . Marital status: Single    Spouse name: Not on file  . Number of children: Not on file  . Years  of education: Not on file  . Highest education level: Not on file  Occupational History  . Not on file  Tobacco Use  . Smoking status: Former Smoker    Types: Cigarettes  . Smokeless tobacco: Never Used  Vaping Use  . Vaping Use: Never used  Substance and Sexual Activity  . Alcohol use: No  . Drug use: No  . Sexual activity: Not on file  Other Topics Concern  . Not on file  Social History Narrative   Reviewed history from 10/13/2009 and no changes required.   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    Drug use-no   Regular exercise-no, at work   Diet: "graze during the day" no fruit, lots of veggies   Social Determinants of Health   Financial Resource Strain:   . Difficulty of Paying Living Expenses:   Food Insecurity:   . Worried About Charity fundraiser in the Last Year:   . Ran  Out of Food in the Last Year:   Transportation Needs:   . Lack of Transportation (Medical):   Marland Kitchen Lack of Transportation (Non-Medical):   Physical Activity:   . Days of Exercise per Week:   . Minutes of Exercise per Session:   Stress:   . Feeling of Stress :   Social Connections:   . Frequency of Communication with Friends and Family:   . Frequency of Social Gatherings with Friends and Family:   . Attends Religious Services:   . Active Member of Clubs or Organizations:   . Attends Archivist Meetings:   Marland Kitchen Marital Status:   Intimate Partner Violence:   . Fear of Current or Ex-Partner:   . Emotionally Abused:   Marland Kitchen Physically Abused:   . Sexually Abused:     Outpatient Medications Prior to Visit  Medication Sig Dispense Refill  . alendronate (FOSAMAX) 70 MG tablet TAKE 1 TABLET WEEKLY TAKE ON EMPTY STOMACH WITH 8 OZ OF WATER(DO NOT LIE DOWN FOR 30 MINUTES) 12 tablet 11  . Calcium Carbonate-Vit D-Min (CALCIUM 1200 PO) Take 2 capsules by mouth daily.    . Cetirizine HCl (ZYRTEC ALLERGY) 10 MG CAPS  Take 1 capsule by mouth daily.    Marland Kitchen FLUoxetine (PROZAC) 10 MG capsule Take 1 capsule (10 mg total) by mouth daily. 90 capsule 2  . hydrochlorothiazide (MICROZIDE) 12.5 MG capsule Take 1 capsule (12.5 mg total) by mouth every morning. 90 capsule 2  . HYDROcodone-acetaminophen (NORCO/VICODIN) 5-325 MG tablet Take 1 tablet by mouth every 6 (six) hours as needed. 10 tablet 0  . hydrocortisone 2.5 % cream Apply topically daily as needed. 30 g 1  . ibuprofen (ADVIL,MOTRIN) 800 MG tablet Take 1 tablet (800 mg total) by mouth every 8 (eight) hours as needed. 60 tablet 0  . meloxicam (MOBIC) 15 MG tablet One tab PO qAM with breakfast for 2 weeks, then daily prn pain. 30 tablet 0  . Multiple Vitamins-Minerals (CENTRUM SILVER ADULT 50+ PO) Take 1 capsule by mouth daily.    . ranitidine (ZANTAC) 150 MG tablet Take 150 mg by mouth daily.    . simvastatin (ZOCOR) 40 MG tablet Take 1 tablet (40 mg total) by mouth at bedtime. 90 tablet 3  . triamcinolone cream (KENALOG) 0.5 % Apply 1 application topically 2 (two) times daily. To affected areas until rash resolved. 60 g 3   No facility-administered medications prior to visit.    Allergies  Allergen Reactions  . Diphenhydramine Hcl Swelling and Other (See Comments)  . Codeine Nausea And Vomiting  . Fish Oil Other (See Comments)    REACTION: vomiting.      Objective:    Physical Exam Vitals and nursing note reviewed.  Constitutional:      Appearance: Normal appearance. She is normal weight.  HENT:     Head: Normocephalic.     Right Ear: Tympanic membrane, ear canal and external ear normal.     Left Ear: Tympanic membrane normal. Decreased hearing noted. Drainage, swelling and tenderness present. No mastoid tenderness.     Ears:     Comments: Significant inflammation present in the auditory canal with the presence of a minor amount of dried serous exudate present. TM intact with no indications of infection or irritation.     Nose: Nose normal. No  congestion or rhinorrhea.     Mouth/Throat:     Mouth: Mucous membranes are moist.     Pharynx: Oropharynx is clear. No oropharyngeal exudate or  posterior oropharyngeal erythema.  Eyes:     Extraocular Movements: Extraocular movements intact.     Conjunctiva/sclera: Conjunctivae normal.     Pupils: Pupils are equal, round, and reactive to light.  Cardiovascular:     Rate and Rhythm: Normal rate and regular rhythm.     Pulses: Normal pulses.     Heart sounds: Normal heart sounds.  Pulmonary:     Effort: Pulmonary effort is normal.     Breath sounds: Normal breath sounds.  Abdominal:     General: Abdomen is flat.     Palpations: Abdomen is soft.  Musculoskeletal:        General: Normal range of motion.     Cervical back: Normal range of motion and neck supple.  Lymphadenopathy:     Cervical: Cervical adenopathy present.  Skin:    General: Skin is warm and dry.     Capillary Refill: Capillary refill takes less than 2 seconds.  Neurological:     General: No focal deficit present.     Mental Status: She is alert and oriented to person, place, and time.  Psychiatric:        Mood and Affect: Mood normal.        Behavior: Behavior normal.        Thought Content: Thought content normal.        Judgment: Judgment normal.     Ht 5\' 7"  (1.702 m)   Wt 164 lb 8 oz (74.6 kg)   BMI 25.76 kg/m  Wt Readings from Last 3 Encounters:  01/27/20 164 lb 8 oz (74.6 kg)  06/12/19 170 lb (77.1 kg)  05/15/19 173 lb (78.5 kg)    There are no preventive care reminders to display for this patient.  There are no preventive care reminders to display for this patient.   Lab Results  Component Value Date   TSH 2.994 08/22/2013   Lab Results  Component Value Date   WBC 5.8 11/08/2010   HGB 14.6 11/08/2010   HCT 42.9 11/08/2010   MCV 91.1 11/08/2010   PLT 251 11/08/2010   Lab Results  Component Value Date   NA 144 03/20/2019   K 4.3 03/20/2019   CO2 28 03/20/2019   GLUCOSE 122 (H)  03/20/2019   BUN 21 03/20/2019   CREATININE 1.20 (H) 03/20/2019   BILITOT 0.6 08/23/2018   ALKPHOS 60 09/08/2016   AST 20 08/23/2018   ALT 28 08/23/2018   PROT 7.0 08/23/2018   ALBUMIN 4.3 09/08/2016   CALCIUM 9.6 03/20/2019   Lab Results  Component Value Date   CHOL 187 03/20/2019   Lab Results  Component Value Date   HDL 44 (L) 03/20/2019   Lab Results  Component Value Date   LDLCALC 118 (H) 03/20/2019   Lab Results  Component Value Date   TRIG 142 03/20/2019   Lab Results  Component Value Date   CHOLHDL 4.3 03/20/2019   Lab Results  Component Value Date   HGBA1C 5.4 03/14/2019       Assessment & Plan:   1. Acute otitis externa of left ear, unspecified type Symptoms and presentation consistent with acute otitis externa. Inflammation, erythema, and serous exudate present in the auditory canal, but no evidence of TM involvement. TM is intact. There is limited pain noted at this time. It is unclear if the serous exudate is from an area damaged with the insertion of the q-tip or from the infectious process. Will plan to treat with otic antibiotic and  steroid drop combination. The choice of cortisporin otic drops 4 times a day for 7 days due to cost effectiveness of the treatment. Instructions on proper instillation and retention of drops discussed. The patient reports that she is often slow to heal and slow to respond to antibiotic treatment. We did discuss that if her symptoms persist beyond the 7 day treatment that she may continue the treatment for up to 12 days. If symptoms persist beyond that point, or if they worsen or if she begins to develop other symptoms of infection, she is to contact the office for further evaluation.  PLAN: - Cortisporin otic drops 4 times a days for the next 7 days- may continue for up to 12 days if symptoms are improving, but not fully resolved - If symptoms persist, worsen, or new symptoms present patient is to follow-up for further  evaluation - Keep ear clean and dry - Utilize clean cotton ball at the opening of the ear when showering to avoid water in the ear - Do not place anything in the ear other than the medication and a cotton ball at the ear opening - If you begin to experience worsening symptoms, new symptoms, or symptoms do not improve, let us know.   - neomycin-polymyxin-hydrocortisone (CORTISPORIN) OTIC solution; Place 4 drops into the left ear 4 (four) times daily for 7 days.  Dispense: 10 mL; Refill: 0   Orma Render, NP

## 2020-04-06 ENCOUNTER — Other Ambulatory Visit: Payer: Self-pay | Admitting: Family Medicine

## 2020-04-06 DIAGNOSIS — E785 Hyperlipidemia, unspecified: Secondary | ICD-10-CM

## 2020-09-08 ENCOUNTER — Other Ambulatory Visit: Payer: Self-pay | Admitting: Family Medicine

## 2020-09-08 NOTE — Telephone Encounter (Signed)
LVM on the phone number listed in patients chart for patient to call back to schedule appt. AM

## 2020-09-08 NOTE — Telephone Encounter (Signed)
Please call pt and have her schedule a f/u appointment for her medication refills, labs.

## 2020-10-06 ENCOUNTER — Other Ambulatory Visit: Payer: Self-pay | Admitting: Family Medicine

## 2020-10-11 ENCOUNTER — Other Ambulatory Visit: Payer: Self-pay

## 2020-10-11 ENCOUNTER — Encounter: Payer: Self-pay | Admitting: Family Medicine

## 2020-10-11 ENCOUNTER — Ambulatory Visit (INDEPENDENT_AMBULATORY_CARE_PROVIDER_SITE_OTHER): Payer: Medicare Other | Admitting: Family Medicine

## 2020-10-11 VITALS — BP 123/84 | HR 93 | Ht 67.0 in | Wt 162.0 lb

## 2020-10-11 DIAGNOSIS — M8580 Other specified disorders of bone density and structure, unspecified site: Secondary | ICD-10-CM

## 2020-10-11 DIAGNOSIS — E559 Vitamin D deficiency, unspecified: Secondary | ICD-10-CM | POA: Diagnosis not present

## 2020-10-11 DIAGNOSIS — I10 Essential (primary) hypertension: Secondary | ICD-10-CM

## 2020-10-11 DIAGNOSIS — E785 Hyperlipidemia, unspecified: Secondary | ICD-10-CM | POA: Diagnosis not present

## 2020-10-11 DIAGNOSIS — R7309 Other abnormal glucose: Secondary | ICD-10-CM

## 2020-10-11 DIAGNOSIS — M810 Age-related osteoporosis without current pathological fracture: Secondary | ICD-10-CM | POA: Diagnosis not present

## 2020-10-11 DIAGNOSIS — E348 Other specified endocrine disorders: Secondary | ICD-10-CM

## 2020-10-11 DIAGNOSIS — N1831 Chronic kidney disease, stage 3a: Secondary | ICD-10-CM

## 2020-10-11 MED ORDER — AMBULATORY NON FORMULARY MEDICATION: 0 refills | Status: DC

## 2020-10-11 MED ORDER — ALENDRONATE SODIUM 70 MG PO TABS: ORAL_TABLET | ORAL | 4 refills | Status: DC

## 2020-10-11 NOTE — Assessment & Plan Note (Signed)
Recheck renal function.  Due to check urine microalbumin as well.

## 2020-10-11 NOTE — Patient Instructions (Signed)
Please schedule your Medicare wellness visit with our nurse.

## 2020-10-11 NOTE — Assessment & Plan Note (Signed)
Well controlled. Continue current regimen. Follow up in  6 mo  

## 2020-10-11 NOTE — Progress Notes (Signed)
Established Patient Office Visit  Subjective:  Patient ID: Margaret [name], female    DOB: 1951/01/16  Age: 70 y.o. MRN: [id number]  CC: No chief complaint on file.   HPI NOLYN EILERT presents for   Hypertension- Pt denies chest pain, SOB, dizziness, or heart palpitations.  Taking meds as directed w/o problems.  Denies medication side effects.    Well otherwise.  She recently moved to be closer to her daughter and her son actually lives with her he works full-time.  She says this is been good for her she has been purposely trying to lose weight and get more healthy.  Osteopenia-she wants to know when she is due for her next bone density she does take calcium with vitamin D regularly.  She needs to schedule her mammogram.   Past Medical History:  Diagnosis Date  . Cancer (HCC)    skin cancer-leg  . GERD (gastroesophageal reflux disease)   . Hyperlipidemia   . Hypertension   . Impaired fasting glucose     Past Surgical History:  Procedure Laterality Date  . bladder tack    . CYSTOCELE REPAIR    . PILONIDAL CYST EXCISION    . VAGINAL HYSTERECTOMY      Family History  Problem Relation Age of Onset  . Stomach cancer Father 35       deceased  . Diabetes Father   . Cancer Father        stomach  . Bell's palsy Mother   . Parkinsonism Mother        died age 55  . Dementia Mother   . Asthma Mother   . Asthma Other   . Depression Other   . Bipolar disorder Other   . Diabetes Sister   . Diabetes Brother   . Colon polyps Brother   . Colon cancer Neg Hx   . Esophageal cancer Neg Hx     Social History   Socioeconomic History  . Marital status: Single    Spouse name: Not on file  . Number of children: Not on file  . Years of education: Not on file  . Highest education level: Not on file  Occupational History  . Not on file  Tobacco Use  . Smoking status: Former Smoker    Types: Cigarettes  . Smokeless tobacco: Never Used  Vaping Use  . Vaping Use:  Never used  Substance and Sexual Activity  . Alcohol use: No  . Drug use: No  . Sexual activity: Not on file  Other Topics Concern  . Not on file  Social History Narrative   Reviewed history from 10/13/2009 and no changes required.   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    Drug use-no   Regular exercise-no, at work   Diet: "graze during the day" no fruit, lots of veggies   Social Determinants of Health   Financial Resource Strain: Not on file  Food Insecurity: Not on file  Transportation Needs: Not on file  Physical Activity: Not on file  Stress: Not on file  Social Connections: Not on file  Intimate Partner Violence: Not on file    Outpatient Medications Prior to Visit  Medication Sig Dispense Refill  . Calcium Carbonate-Vit D-Min (CALCIUM 1200 PO) Take 2 capsules by mouth daily.    . Cetirizine HCl 10 MG CAPS Take 1 capsule by mouth daily.    Marland Kitchen  FLUoxetine (PROZAC) 10 MG capsule TAKE 1 CAPSULE BY MOUTH DAILY 90 capsule 1  . hydrochlorothiazide (MICROZIDE) 12.5 MG capsule TAKE 1 CAPSULE(12.5 MG) BY MOUTH EVERY MORNING 90 capsule 2  . meloxicam (MOBIC) 15 MG tablet One tab PO qAM with breakfast for 2 weeks, then daily prn pain. 30 tablet 0  . Multiple Vitamins-Minerals (CENTRUM SILVER ADULT 50+ PO) Take 1 capsule by mouth daily.    . ranitidine (ZANTAC) 150 MG tablet Take 150 mg by mouth daily.    . simvastatin (ZOCOR) 40 MG tablet TAKE 1 TABLET BY MOUTH AT BEDTIME 90 tablet 3  . alendronate (FOSAMAX) 70 MG tablet TAKE 1 TABLET WEEKLY TAKE ON EMPTY STOMACH WITH 8 OZ OF WATER(DO NOT LIE DOWN FOR 30 MINUTES) 12 tablet 11  . hydrocortisone 2.5 % cream Apply topically daily as needed. 30 g 1  . HYDROcodone-acetaminophen (NORCO/VICODIN) 5-325 MG tablet Take 1 tablet by mouth every 6 (six) hours as needed. 10 tablet 0  . ibuprofen (ADVIL,MOTRIN) 800 MG tablet Take 1 tablet (800 mg total) by mouth  every 8 (eight) hours as needed. 60 tablet 0  . triamcinolone cream (KENALOG) 0.5 % Apply 1 application topically 2 (two) times daily. To affected areas until rash resolved. 60 g 3   No facility-administered medications prior to visit.    Allergies  Allergen Reactions  . Diphenhydramine Hcl Swelling and Other (See Comments)  . Codeine Nausea And Vomiting  . Fish Oil Other (See Comments)    REACTION: vomiting.    ROS Review of Systems    Objective:    Physical Exam Constitutional:      Appearance: She is well-developed.  HENT:     Head: Normocephalic and atraumatic.  Cardiovascular:     Rate and Rhythm: Normal rate and regular rhythm.     Heart sounds: Normal heart sounds.  Pulmonary:     Effort: Pulmonary effort is normal.     Breath sounds: Normal breath sounds.  Skin:    General: Skin is warm and dry.  Neurological:     Mental Status: She is alert and oriented to person, place, and time.  Psychiatric:        Behavior: Behavior normal.     BP 123/84   Pulse 93   Ht 5\' 7"  (1.702 m)   Wt 162 lb (73.5 kg)   BMI 25.37 kg/m  Wt Readings from Last 3 Encounters:  10/11/20 162 lb (73.5 kg)  01/27/20 164 lb 8 oz (74.6 kg)  06/12/19 170 lb (77.1 kg)     Health Maintenance Due  Topic Date Due  . Fecal DNA (Cologuard)  03/16/2020  . MAMMOGRAM  09/04/2020  . TETANUS/TDAP  10/04/2020    There are no preventive care reminders to display for this patient.  Lab Results  Component Value Date   TSH 2.994 08/22/2013   Lab Results  Component Value Date   WBC 5.8 11/08/2010   HGB 14.6 11/08/2010   HCT 42.9 11/08/2010   MCV 91.1 11/08/2010   PLT 251 11/08/2010   Lab Results  Component Value Date   NA 144 03/20/2019   K 4.3 03/20/2019   CO2 28 03/20/2019   GLUCOSE 122 (H) 03/20/2019   BUN 21 03/20/2019   CREATININE 1.20 (H) 03/20/2019   BILITOT 0.6 08/23/2018   ALKPHOS 60 09/08/2016   AST 20 08/23/2018   ALT 28 08/23/2018   PROT 7.0 08/23/2018   ALBUMIN  4.3 09/08/2016   CALCIUM 9.6 03/20/2019  Lab Results  Component Value Date   CHOL 187 03/20/2019   Lab Results  Component Value Date   HDL 44 (L) 03/20/2019   Lab Results  Component Value Date   LDLCALC 118 (H) 03/20/2019   Lab Results  Component Value Date   TRIG 142 03/20/2019   Lab Results  Component Value Date   CHOLHDL 4.3 03/20/2019   Lab Results  Component Value Date   HGBA1C 5.4 03/14/2019      Assessment & Plan:   Problem List Items Addressed This Visit      Cardiovascular and Mediastinum   Essential hypertension - Primary    Well controlled. Continue current regimen. Follow up in  6 mo       Relevant Orders   COMPLETE METABOLIC PANEL WITH GFR   Lipid Panel w/reflex Direct LDL   HgB A1c   Vitamin D (25 hydroxy)     Musculoskeletal and Integument   Osteoporosis   Relevant Medications   alendronate (FOSAMAX) 70 MG tablet   Osteopenia    Refilled Fosamax.  Plan to recheck DEXA next year.        Genitourinary   CKD (chronic kidney disease) stage 3, GFR 30-59 ml/min (HCC)    Recheck renal function.  Due to check urine microalbumin as well.      Relevant Orders   Urine Microalbumin w/creat. ratio     Other   Vitamin D deficiency    Recheck vitamin D level she has been taking a supplement.      Relevant Orders   COMPLETE METABOLIC PANEL WITH GFR   Lipid Panel w/reflex Direct LDL   HgB A1c   Vitamin D (25 hydroxy)   Hyperlipidemia   Relevant Orders   COMPLETE METABOLIC PANEL WITH GFR   Lipid Panel w/reflex Direct LDL   HgB A1c   Vitamin D (25 hydroxy)    Other Visit Diagnoses    Other specified endocrine disorders       Relevant Orders   COMPLETE METABOLIC PANEL WITH GFR   Lipid Panel w/reflex Direct LDL   HgB A1c   Vitamin D (25 hydroxy)   Abnormal glucose       Relevant Orders   HgB A1c     Prescription given for Tdap to be given at the pharmacy since she now has Medicare.  Did encourage her to schedule a follow-up Medicare  wellness exam.  Meds ordered this encounter  Medications  . alendronate (FOSAMAX) 70 MG tablet    Sig: TAKE 1 TABLET WEEKLY TAKE ON EMPTY STOMACH WITH 8 OZ OF WATER(DO NOT LIE DOWN FOR 30 MINUTES)    Dispense:  12 tablet    Refill:  4  . AMBULATORY NON FORMULARY MEDICATION    Sig: Medication Name: Tdap IM x 1    Dispense:  1 Units    Refill:  0    Follow-up: Return in about 6 months (around 04/13/2021) for Hypertension.    Beatrice Lecher, MD

## 2020-10-11 NOTE — Assessment & Plan Note (Signed)
Refilled Fosamax.  Plan to recheck DEXA next year.

## 2020-10-11 NOTE — Assessment & Plan Note (Signed)
Recheck vitamin D level she has been taking a supplement.

## 2020-10-27 ENCOUNTER — Ambulatory Visit: Payer: Medicare Other

## 2020-11-22 ENCOUNTER — Telehealth: Payer: Self-pay | Admitting: General Practice

## 2020-11-22 ENCOUNTER — Ambulatory Visit: Payer: Medicare Other

## 2020-11-22 NOTE — Telephone Encounter (Signed)
Attempted to call patient for Telephone Medicare Annual Wellness visit.  10:53a.m.- no answer 11:04 a.m - no answer, left message 11:26 a.m.- no answer left message.

## 2021-01-24 ENCOUNTER — Encounter: Payer: Self-pay | Admitting: Family Medicine

## 2021-04-01 IMAGING — DX LEFT WRIST - COMPLETE 3+ VIEW
3 series · 3 of 3 positions shown · non-contrast
Comparison: None.

CLINICAL DATA: Fell today and injured left wrist.

EXAM:
LEFT WRIST - COMPLETE 3+ VIEW

[wrist pa]
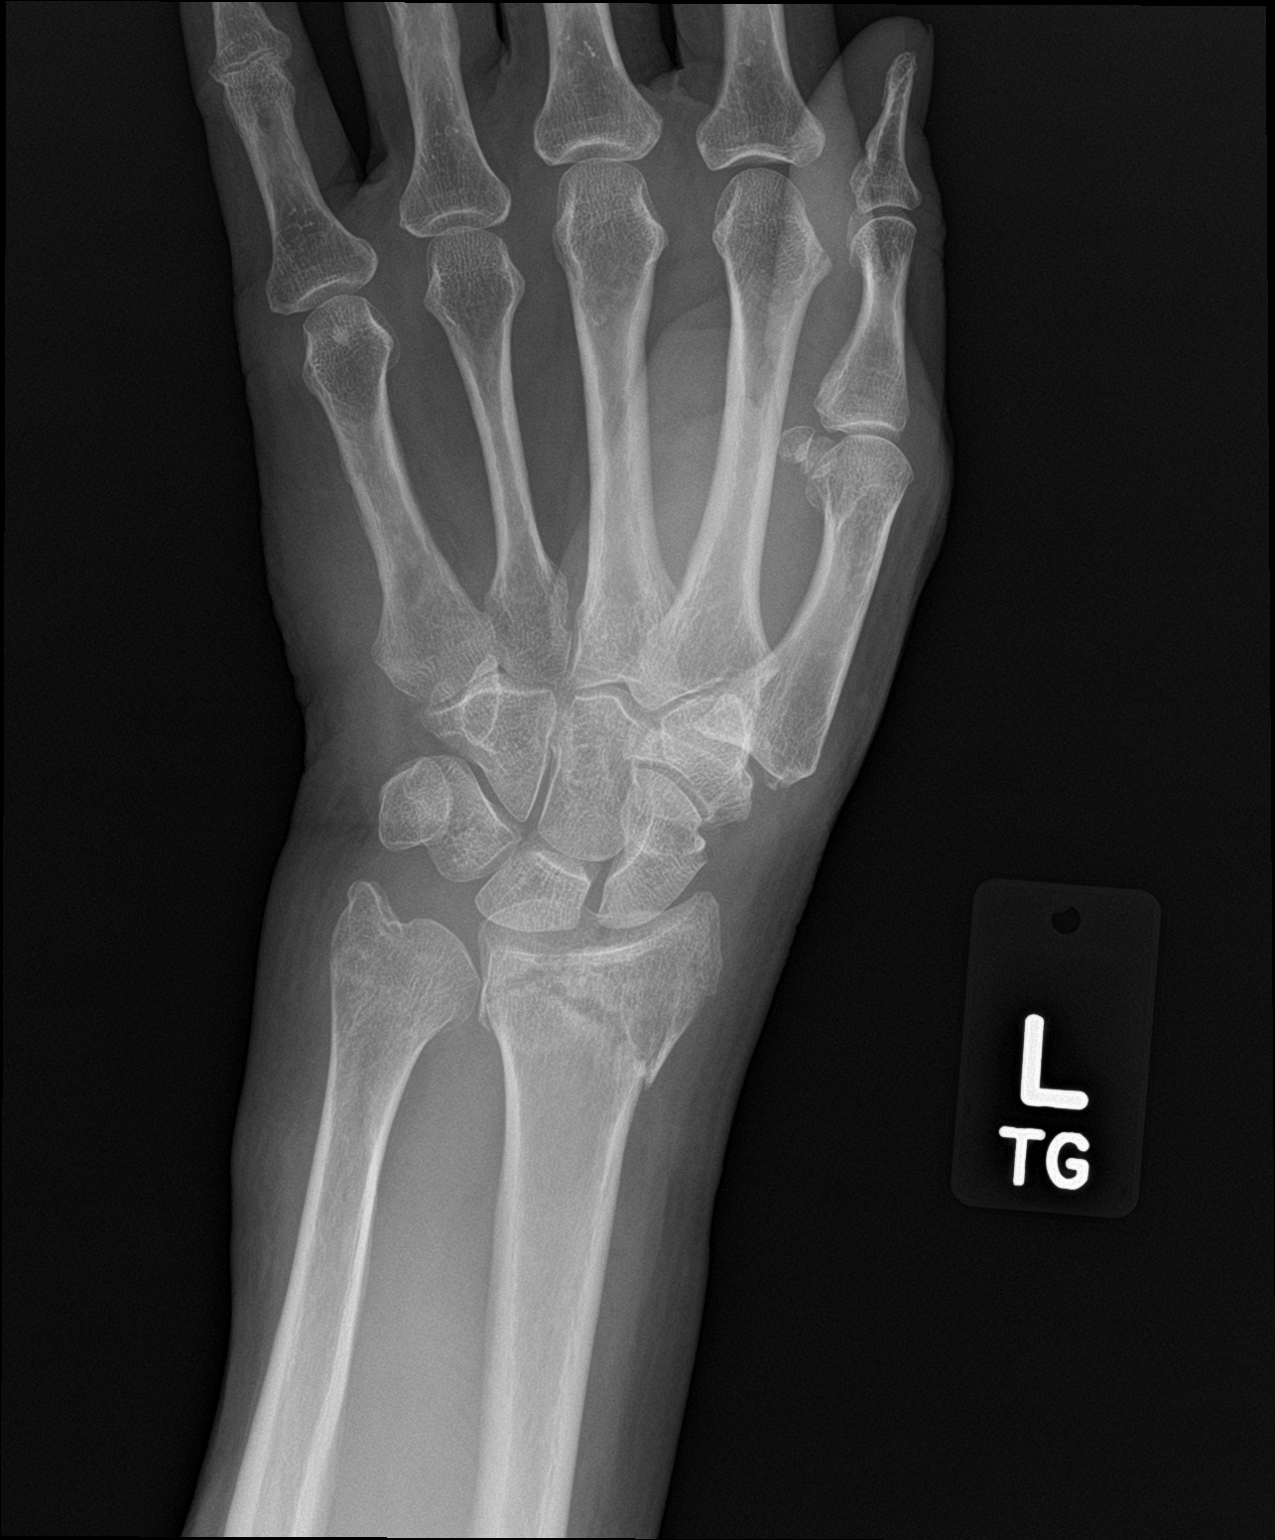

[wrist obl]
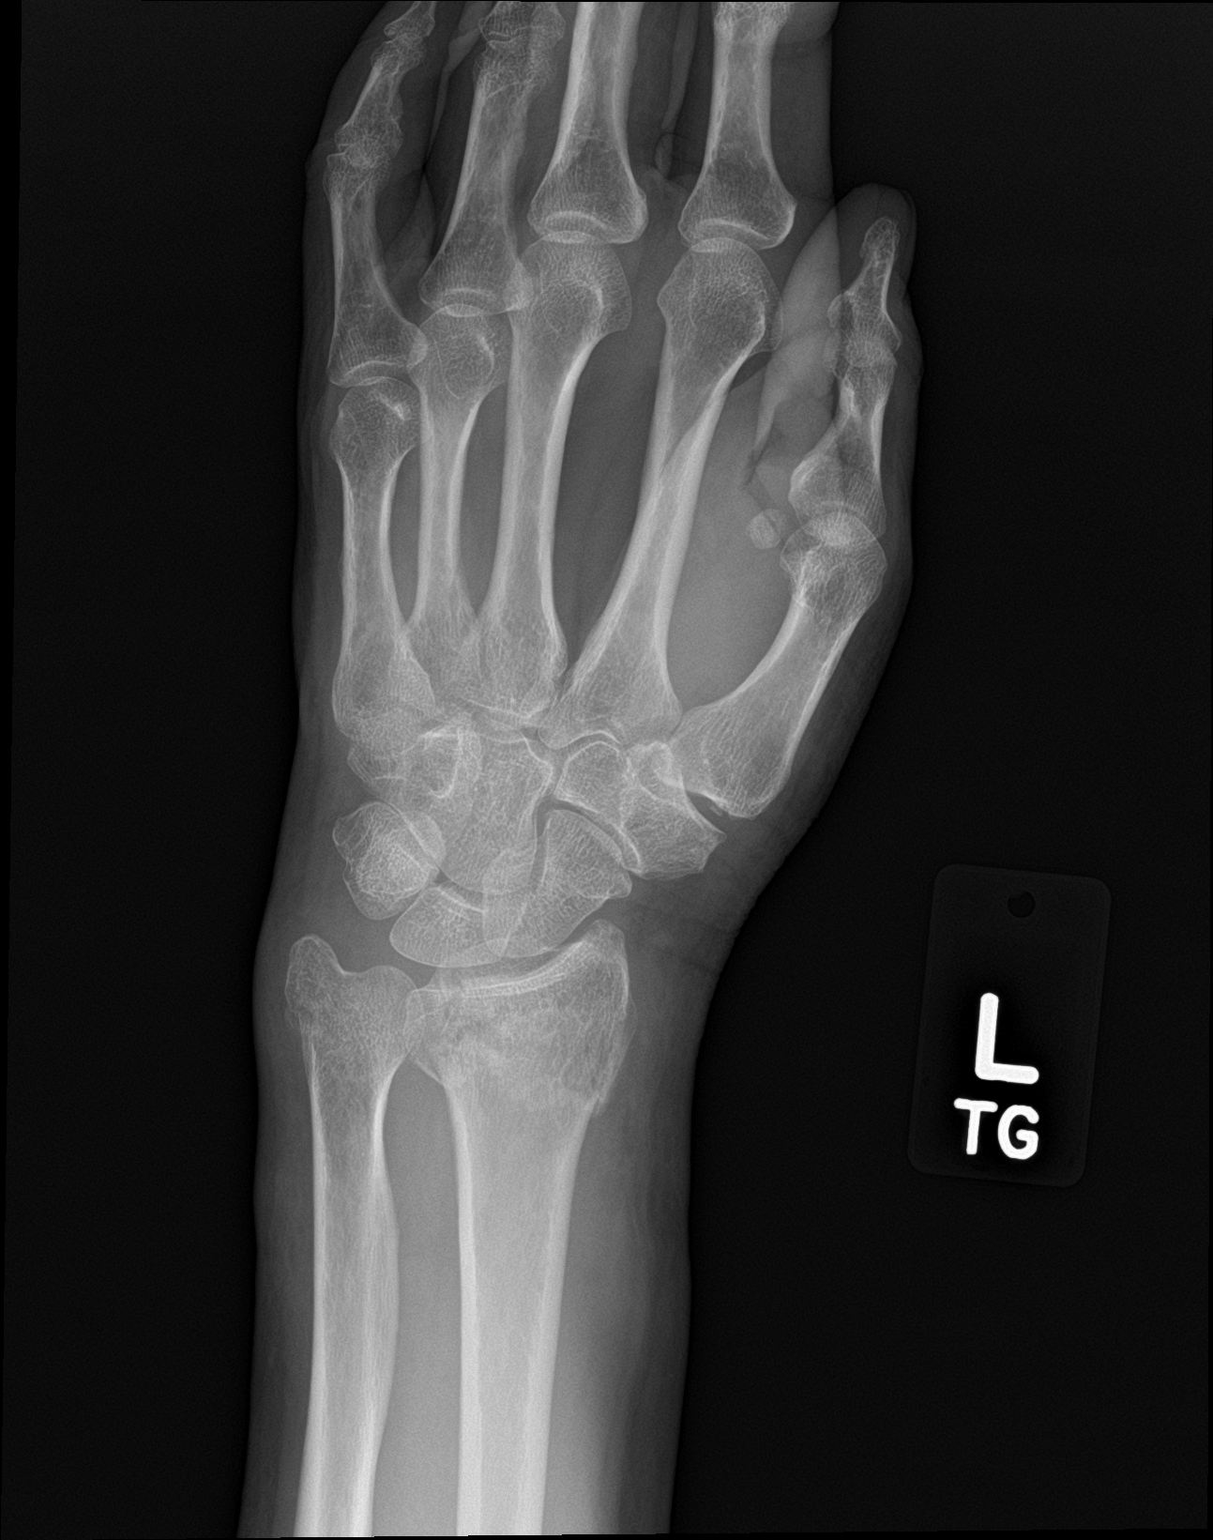

[wrist lat]
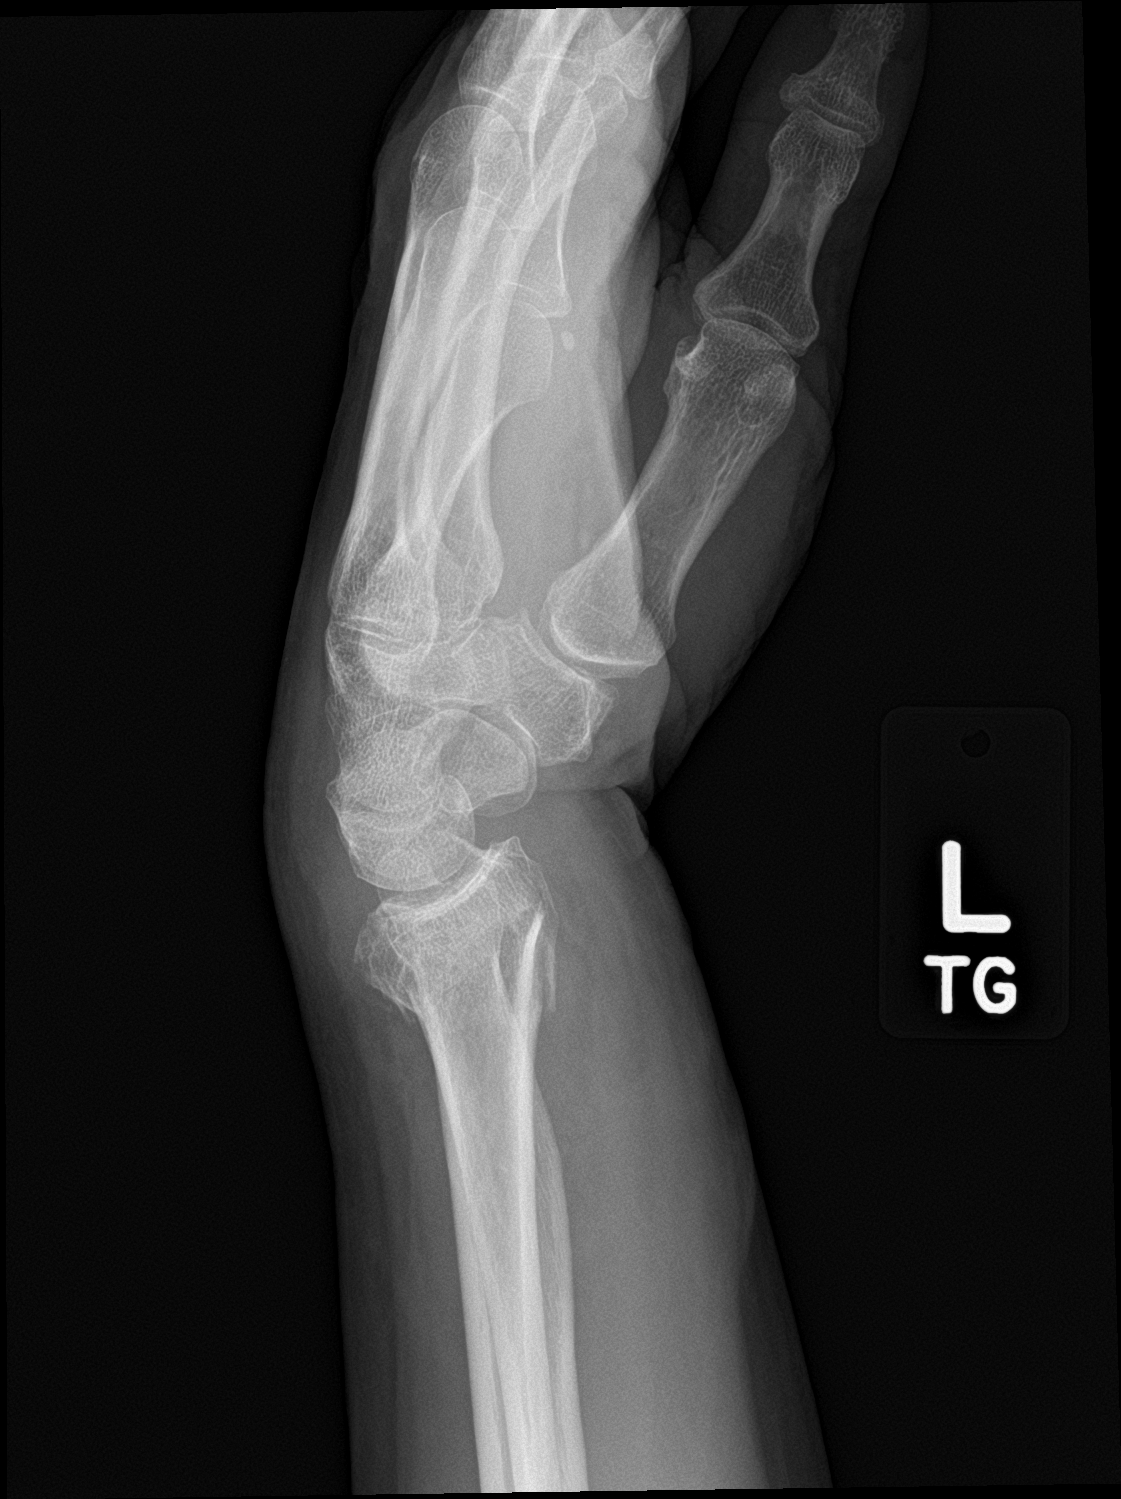

[3 of 3 positions shown; findings below may reference images not displayed]

FINDINGS: There is a comminuted intra-articular fracture of the distal radius
with dorsal impaction (Colles type fracture). There is also a
nondisplaced fracture through the base of the ulnar styloid.

No other definite fractures are identified. Degenerative changes
noted at the CMC joint of the thumb.
IMPRESSION: Dorsally impacted intra-articular fracture of the distal radius.

Nondisplaced fracture at the base of the ulnar styloid.

## 2021-04-13 ENCOUNTER — Ambulatory Visit (INDEPENDENT_AMBULATORY_CARE_PROVIDER_SITE_OTHER): Payer: Medicare Other | Admitting: Family Medicine

## 2021-04-13 ENCOUNTER — Encounter: Payer: Self-pay | Admitting: Family Medicine

## 2021-04-13 VITALS — BP 130/80 | HR 86 | Ht 67.0 in | Wt 161.0 lb

## 2021-04-13 DIAGNOSIS — R011 Cardiac murmur, unspecified: Secondary | ICD-10-CM | POA: Diagnosis not present

## 2021-04-13 DIAGNOSIS — Z780 Asymptomatic menopausal state: Secondary | ICD-10-CM

## 2021-04-13 DIAGNOSIS — E785 Hyperlipidemia, unspecified: Secondary | ICD-10-CM | POA: Diagnosis not present

## 2021-04-13 DIAGNOSIS — Z1211 Encounter for screening for malignant neoplasm of colon: Secondary | ICD-10-CM | POA: Diagnosis not present

## 2021-04-13 DIAGNOSIS — M8580 Other specified disorders of bone density and structure, unspecified site: Secondary | ICD-10-CM | POA: Diagnosis not present

## 2021-04-13 DIAGNOSIS — F339 Major depressive disorder, recurrent, unspecified: Secondary | ICD-10-CM

## 2021-04-13 DIAGNOSIS — I10 Essential (primary) hypertension: Secondary | ICD-10-CM

## 2021-04-13 DIAGNOSIS — E559 Vitamin D deficiency, unspecified: Secondary | ICD-10-CM

## 2021-04-13 DIAGNOSIS — E348 Other specified endocrine disorders: Secondary | ICD-10-CM

## 2021-04-13 DIAGNOSIS — N1831 Chronic kidney disease, stage 3a: Secondary | ICD-10-CM | POA: Diagnosis not present

## 2021-04-13 DIAGNOSIS — Z1231 Encounter for screening mammogram for malignant neoplasm of breast: Secondary | ICD-10-CM

## 2021-04-13 MED ORDER — SHINGRIX 50 MCG/0.5ML IM SUSR: 0.5000 mL | Freq: Once | INTRAMUSCULAR | 0 refills | Status: AC

## 2021-04-13 MED ORDER — TETANUS-DIPHTH-ACELL PERTUSSIS 5-2.5-18.5 LF-MCG/0.5 IM SUSP: 0.5000 mL | Freq: Once | INTRAMUSCULAR | 0 refills | Status: AC

## 2021-04-13 MED ORDER — FLUOXETINE HCL 10 MG PO CAPS: 10.0000 mg | ORAL_CAPSULE | Freq: Every day | ORAL | 1 refills | Status: DC

## 2021-04-13 MED ORDER — MELOXICAM 15 MG PO TABS: ORAL_TABLET | ORAL | 2 refills | Status: DC

## 2021-04-13 MED ORDER — HYDROCHLOROTHIAZIDE 12.5 MG PO CAPS: 12.5000 mg | ORAL_CAPSULE | Freq: Every day | ORAL | 1 refills | Status: DC

## 2021-04-13 MED ORDER — TETANUS-DIPHTH-ACELL PERTUSSIS 5-2.5-18.5 LF-MCG/0.5 IM SUSP: 0.5000 mL | Freq: Once | INTRAMUSCULAR | 0 refills | Status: DC

## 2021-04-13 MED ORDER — SHINGRIX 50 MCG/0.5ML IM SUSR: 0.5000 mL | Freq: Once | INTRAMUSCULAR | 0 refills | Status: DC

## 2021-04-13 MED ORDER — SIMVASTATIN 40 MG PO TABS: 40.0000 mg | ORAL_TABLET | Freq: Every day | ORAL | 3 refills | Status: DC

## 2021-04-13 NOTE — Assessment & Plan Note (Addendum)
overdule to recheck renal function.  Continue to follow every 6 months.  Last serum creatinine was 1.2.

## 2021-04-13 NOTE — Assessment & Plan Note (Signed)
Well controlled. Continue current regimen. Follow up in  6 mo  

## 2021-04-13 NOTE — Assessment & Plan Note (Signed)
Not previously detected.  We will move forward with echocardiogram.

## 2021-04-13 NOTE — Progress Notes (Signed)
Established Patient Office Visit  Subjective:  Patient ID: Margaret [name], female    DOB: 1950-10-14  Age: 70 y.o. MRN: [id number]  CC:  Chief Complaint  Patient presents with   Hypertension    HPI [name] presents for   Hypertension- Pt denies chest pain, SOB, dizziness, or heart palpitations.  Taking meds as directed w/o problems.  Denies medication side effects.     F/U CKD -no recent problems or changes.  Past Medical History:  Diagnosis Date   Cancer ([place])    skin cancer-leg   GERD (gastroesophageal reflux disease)    Hyperlipidemia    Hypertension    Impaired fasting glucose     Past Surgical History:  Procedure Laterality Date   bladder tack     CYSTOCELE REPAIR     PILONIDAL CYST EXCISION     VAGINAL HYSTERECTOMY      Family History  Problem Relation Age of Onset   Stomach cancer Father 83       deceased   Diabetes Father    Cancer Father        stomach   Bell's palsy Mother    Parkinsonism Mother        died age 49   Dementia Mother    Asthma Mother    Asthma Other    Depression Other    Bipolar disorder Other    Diabetes Sister    Diabetes Brother    Colon polyps Brother    Colon cancer Neg Hx    Esophageal cancer Neg Hx     Social History   Socioeconomic History   Marital status: Single    Spouse name: Not on file   Number of children: Not on file   Years of education: Not on file   Highest education level: Not on file  Occupational History   Not on file  Tobacco Use   Smoking status: Former    Types: Cigarettes   Smokeless tobacco: Never  Vaping Use   Vaping Use: Never used  Substance and Sexual Activity   Alcohol use: No   Drug use: No   Sexual activity: Not on file  Other Topics Concern   Not on file  Social History Narrative   ** Merged History Encounter **       Reviewed history from 10/13/2009 and no changes required.   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    Drug use-no   Regular exercise-no, at work   Diet: "graze during the day" no fruit, lots of veggies   Social Determinants of Health   Financial Resource Strain: Not on file  Food Insecurity: Not on file  Transportation Needs: Not on file  Physical Activity: Not on file  Stress: Not on file  Social Connections: Not on file  Intimate Partner Violence: Not on file    Outpatient Medications Prior to Visit  Medication Sig Dispense Refill   alendronate (FOSAMAX) 70 MG tablet TAKE 1 TABLET WEEKLY TAKE ON EMPTY STOMACH WITH 8 OZ OF WATER(DO NOT LIE DOWN FOR 30 MINUTES) 12 tablet 4   Calcium Carbonate-Vit D-Min (CALCIUM 1200 PO) Take 2 capsules by mouth daily.     Cetirizine HCl 10 MG CAPS Take 1 capsule by mouth daily.     Multiple Vitamins-Minerals (CENTRUM SILVER ADULT 50+ PO) Take 1 capsule by mouth daily.     ranitidine (ZANTAC)  150 MG tablet Take 150 mg by mouth daily.     FLUoxetine (PROZAC) 10 MG capsule TAKE 1 CAPSULE BY MOUTH DAILY 90 capsule 1   hydrochlorothiazide (MICROZIDE) 12.5 MG capsule TAKE 1 CAPSULE(12.5 MG) BY MOUTH EVERY MORNING 90 capsule 2   simvastatin (ZOCOR) 40 MG tablet TAKE 1 TABLET BY MOUTH AT BEDTIME 90 tablet 3   AMBULATORY NON FORMULARY MEDICATION Medication Name: Tdap IM x 1 1 Units 0   meloxicam (MOBIC) 15 MG tablet One tab PO qAM with breakfast for 2 weeks, then daily prn pain. 30 tablet 0   No facility-administered medications prior to visit.    Allergies  Allergen Reactions   Diphenhydramine Hcl Swelling and Other (See Comments)   Codeine Nausea And Vomiting   Fish Oil Other (See Comments)    REACTION: vomiting.    ROS Review of Systems    Objective:    Physical Exam Constitutional:      Appearance: Normal appearance. She is well-developed.  HENT:     Head: Normocephalic and atraumatic.  Cardiovascular:     Rate and Rhythm: Normal rate and regular rhythm.     Heart sounds: Murmur heard.      Comments: 2 out of 6 systolic murmur best heard at the right sternal border. Pulmonary:     Effort: Pulmonary effort is normal.     Breath sounds: Normal breath sounds.  Skin:    General: Skin is warm and dry.  Neurological:     Mental Status: She is alert and oriented to person, place, and time.  Psychiatric:        Behavior: Behavior normal.    BP 130/80   Pulse 86   Ht '5\' 7"'$  (1.702 m)   Wt 161 lb (73 kg)   SpO2 100%   BMI 25.22 kg/m  Wt Readings from Last 3 Encounters:  04/13/21 161 lb (73 kg)  10/11/20 162 lb (73.5 kg)  01/27/20 164 lb 8 oz (74.6 kg)     Health Maintenance Due  Topic Date Due   MAMMOGRAM  09/04/2020    There are no preventive care reminders to display for this patient.  Lab Results  Component Value Date   TSH 2.994 08/22/2013   Lab Results  Component Value Date   WBC 5.8 11/08/2010   HGB 14.6 11/08/2010   HCT 42.9 11/08/2010   MCV 91.1 11/08/2010   PLT 251 11/08/2010   Lab Results  Component Value Date   NA 144 03/20/2019   K 4.3 03/20/2019   CO2 28 03/20/2019   GLUCOSE 122 (H) 03/20/2019   BUN 21 03/20/2019   CREATININE 1.20 (H) 03/20/2019   BILITOT 0.6 08/23/2018   ALKPHOS 60 09/08/2016   AST 20 08/23/2018   ALT 28 08/23/2018   PROT 7.0 08/23/2018   ALBUMIN 4.3 09/08/2016   CALCIUM 9.6 03/20/2019   Lab Results  Component Value Date   CHOL 187 03/20/2019   Lab Results  Component Value Date   HDL 44 (L) 03/20/2019   Lab Results  Component Value Date   LDLCALC 118 (H) 03/20/2019   Lab Results  Component Value Date   TRIG 142 03/20/2019   Lab Results  Component Value Date   CHOLHDL 4.3 03/20/2019   Lab Results  Component Value Date   HGBA1C 5.4 03/14/2019      Assessment & Plan:   Problem List Items Addressed This Visit       Cardiovascular and Mediastinum   Essential hypertension -  Primary    Well controlled. Continue current regimen. Follow up in  84mo      Relevant Medications   simvastatin  (ZOCOR) 40 MG tablet   hydrochlorothiazide (MICROZIDE) 12.5 MG capsule   Other Relevant Orders   Urine Microalbumin w/creat. ratio   Vitamin D (25 hydroxy)   Lipid Panel w/reflex Direct LDL   COMPLETE METABOLIC PANEL WITH GFR     Musculoskeletal and Integument   Osteopenia    Did discuss going ahead and getting an updated bone density test.  She is to continue Fosamax as well as her calcium and vitamin D.        Genitourinary   CKD (chronic kidney disease) stage 3, GFR 30-59 ml/min (HCC)    overdule to recheck renal function.  Continue to follow every 6 months.  Last serum creatinine was 1.2.      Relevant Orders   Urine Microalbumin w/creat. ratio   Vitamin D (25 hydroxy)   Lipid Panel w/reflex Direct LDL   COMPLETE METABOLIC PANEL WITH GFR     Other   Vitamin D deficiency    She is taking an over-the-counter supplement.      Relevant Orders   Urine Microalbumin w/creat. ratio   Vitamin D (25 hydroxy)   Lipid Panel w/reflex Direct LDL   COMPLETE METABOLIC PANEL WITH GFR   Hyperlipidemia    Tolerating statin well.  Will need refills.  Due to recheck liver and lipids.       Relevant Medications   simvastatin (ZOCOR) 40 MG tablet   hydrochlorothiazide (MICROZIDE) 12.5 MG capsule   Other Relevant Orders   Urine Microalbumin w/creat. ratio   Vitamin D (25 hydroxy)   Lipid Panel w/reflex Direct LDL   COMPLETE METABOLIC PANEL WITH GFR   Heart murmur    Not previously detected.  We will move forward with echocardiogram.      Relevant Orders   ECHOCARDIOGRAM COMPLETE   [place]    Happy with her current regimen of fluoxetine.  Would like refills on the medication.  No recent changes or concerns.  [place] who recently just separated from her husband.  She has been there to try to help support her more.      Relevant Medications   FLUoxetine (PROZAC) 10 MG capsule   Other Visit Diagnoses      Other specified endocrine disorders       Relevant Orders   Urine Microalbumin w/creat. ratio   Vitamin D (25 hydroxy)   Lipid Panel w/reflex Direct LDL   COMPLETE METABOLIC PANEL WITH GFR   Screening mammogram for breast cancer       Relevant Orders   MM 3D SCREEN BREAST BILATERAL   Screening for colon cancer       Relevant Orders   Cologuard   Post-menopausal       Relevant Orders   DG Bone Density       Encouraged to get her shingles vaccine and tetanus done at the pharmacy since it is covered under Medicare part D.  Prescription printed and given today.  Meds ordered this encounter  Medications   DISCONTD: Zoster Vaccine Adjuvanted ([facility] injection    Sig: Inject 0.5 mLs into the muscle once for 1 dose.    Dispense:  0.5 mL    Refill:  0    Shingrix IM x 1 now, 2nd injection to be given in 2-6 months  DISCONTD: Tdap (BOOSTRIX) 5-2.5-18.5 LF-MCG/0.5 injection    Sig: Inject 0.5 mLs into the muscle once for 1 dose.    Dispense:  0.5 mL    Refill:  0   Tdap (BOOSTRIX) 5-2.5-18.5 LF-MCG/0.5 injection    Sig: Inject 0.5 mLs into the muscle once for 1 dose.    Dispense:  0.5 mL    Refill:  0   Zoster Vaccine Adjuvanted [facility]) injection    Sig: Inject 0.5 mLs into the muscle once for 1 dose.    Dispense:  0.5 mL    Refill:  0    Shingrix IM x 1 now, 2nd injection to be given in 2-6 months   simvastatin (ZOCOR) 40 MG tablet    Sig: Take 1 tablet (40 mg total) by mouth at bedtime.    Dispense:  90 tablet    Refill:  3   meloxicam (MOBIC) 15 MG tablet    Sig: One tab PO qAM with breakfast for 2 weeks, then daily prn pain.    Dispense:  30 tablet    Refill:  2   FLUoxetine (PROZAC) 10 MG capsule    Sig: Take 1 capsule (10 mg total) by mouth daily.    Dispense:  90 capsule    Refill:  1   hydrochlorothiazide (MICROZIDE) 12.5 MG capsule    Sig: Take 1 capsule (12.5 mg total) by mouth daily.    Dispense:  90 capsule    Refill:  1    Follow-up: Return in about  6 months (around 10/11/2021) for bp/ckd.   I spent 40 minutes on the day of the encounter to include pre-visit record review, face-to-face time with the patient and post visit ordering of test.   Beatrice Lecher, MD

## 2021-04-13 NOTE — Assessment & Plan Note (Signed)
Did discuss going ahead and getting an updated bone density test.  She is to continue Fosamax as well as her calcium and vitamin D.

## 2021-04-13 NOTE — Assessment & Plan Note (Signed)
She is taking an over-the-counter supplement.

## 2021-04-13 NOTE — Assessment & Plan Note (Signed)
Happy with her current regimen of fluoxetine.  Would like refills on the medication.  No recent changes or concerns.  She recently moved to [place] to be closer to her daughter who recently just separated from her husband.  She has been there to try to help support her more.

## 2021-04-13 NOTE — Assessment & Plan Note (Signed)
Tolerating statin well.  Will need refills.  Due to recheck liver and lipids.

## 2021-04-15 ENCOUNTER — Encounter: Payer: Self-pay | Admitting: Family Medicine

## 2021-04-15 ENCOUNTER — Ambulatory Visit (INDEPENDENT_AMBULATORY_CARE_PROVIDER_SITE_OTHER): Payer: Medicare Other | Admitting: Family Medicine

## 2021-04-15 DIAGNOSIS — Z0000 Encounter for general adult medical examination without abnormal findings: Secondary | ICD-10-CM | POA: Diagnosis not present

## 2021-04-15 NOTE — Progress Notes (Signed)
MEDICARE ANNUAL WELLNESS VISIT  04/15/2021  Telephone Visit Disclaimer This Medicare AWV was conducted by telephone due to national recommendations for restrictions regarding the COVID-19 Pandemic (e.g. social distancing).  I verified, using two identifiers, that I am speaking with Margaret [name] or their authorized healthcare agent. I discussed the limitations, risks, security, and privacy concerns of performing an evaluation and management service by telephone and the potential availability of an in-person appointment in the future. The patient expressed understanding and agreed to proceed.  Location of Patient: Home Location of Provider (nurse):  In the office.  Subjective:    Margaret [name] is a 70 y.o. female patient of Metheney, Rene Kocher, MD who had a Medicare Annual Wellness Visit today via telephone. Margaret [name] is Retired and lives with their son. she has 2 children. she reports that she is socially active and does interact with friends/family regularly. she is minimally physically active and enjoys reading, playing the pani and spending time with her grandson..  Patient Care Team: Hali Marry, MD as PCP - General  Advanced Directives 04/15/2021 12/04/2017 08/18/2014  Does Patient Have a Medical Advance Directive? Yes No No  Type of Advance Directive Living will;Healthcare Power of Attorney - -  Does patient want to make changes to medical advance directive? No - Patient declined - -  Copy of [place] in Chart? No - copy requested - -  Would patient like information on creating a medical advance directive? - - Yes - Educational materials given    Hospital Utilization Over the Past 12 Months: # of hospitalizations or ER visits: 0 # of surgeries: 0  Review of Systems    Patient reports that her overall health is worse compared to last year.  History obtained from chart review and the patient  Patient Reported Readings (BP, Pulse, CBG, Weight,  etc) none  Pain Assessment Pain : No/denies pain     Current Medications & Allergies (verified) Allergies as of 04/15/2021       Reactions   Diphenhydramine Hcl Swelling, Other (See Comments)   Codeine Nausea And Vomiting   Fish Oil Other (See Comments)   REACTION: vomiting.        Medication List        Accurate as of April 15, 2021 10:44 AM. If you have any questions, ask your nurse or doctor.          alendronate 70 MG tablet Commonly known as: FOSAMAX TAKE 1 TABLET WEEKLY TAKE ON EMPTY STOMACH WITH 8 OZ OF WATER(DO NOT LIE DOWN FOR 30 MINUTES)   CALCIUM 1200 PO Take 2 capsules by mouth daily.   CENTRUM SILVER ADULT 50+ PO Take 1 capsule by mouth daily.   Cetirizine HCl 10 MG Caps Take 1 capsule by mouth daily.   FLUoxetine 10 MG capsule Commonly known as: PROZAC Take 1 capsule (10 mg total) by mouth daily.   hydrochlorothiazide 12.5 MG capsule Commonly known as: MICROZIDE Take 1 capsule (12.5 mg total) by mouth daily.   meloxicam 15 MG tablet Commonly known as: MOBIC One tab PO qAM with breakfast for 2 weeks, then daily prn pain.   ranitidine 150 MG tablet Commonly known as: ZANTAC Take 150 mg by mouth daily.   simvastatin 40 MG tablet Commonly known as: ZOCOR Take 1 tablet (40 mg total) by mouth at bedtime.        History (reviewed): Past Medical History:  Diagnosis Date   Cancer ([place])  skin cancer-leg   GERD (gastroesophageal reflux disease)    Hyperlipidemia    Hypertension    Impaired fasting glucose    Past Surgical History:  Procedure Laterality Date   bladder tack     CYSTOCELE REPAIR     PILONIDAL CYST EXCISION     VAGINAL HYSTERECTOMY     Family History  Problem Relation Age of Onset   Stomach cancer Father 41       deceased   Diabetes Father    Cancer Father        stomach   Bell's palsy Mother    Parkinsonism Mother        died age 20   Dementia Mother    Asthma Mother    Asthma Other    Depression  Other    Bipolar disorder Other    Diabetes Sister    Diabetes Brother    Colon polyps Brother    Colon cancer Neg Hx    Esophageal cancer Neg Hx    Social History   Socioeconomic History   Marital status: Widowed    Spouse name: Not on file   Number of children: 2   Years of education: 16   Highest education level: Bachelor's degree (e.g., BA, AB, BS)  Occupational History   Occupation: Retired  Tobacco Use   Smoking status: Former    Types: Cigarettes   Smokeless tobacco: Never  Vaping Use   Vaping Use: Never used  Substance and Sexual Activity   Alcohol use: No   Drug use: No   Sexual activity: Not on file  Other Topics Concern   Not on file  Social History Narrative   Lives with her son. They live near her daughter. She enjoys reading, playing the piano and spending time with her grandson.   Social Determinants of Health   Financial Resource Strain: Low Risk    Difficulty of Paying Living Expenses: Not hard at all  Food Insecurity: No Food Insecurity   Worried About Charity fundraiser in the Last Year: Never true   [place] in the Last Year: Never true  Transportation Needs: No Transportation Needs   Lack of Transportation (Medical): No   Lack of Transportation (Non-Medical): No  Physical Activity: Inactive   Days of Exercise per Week: 0 days   Minutes of Exercise per Session: 0 min  Stress: No Stress Concern Present   Feeling of Stress : Not at all  Social Connections: Socially Isolated   Frequency of Communication with Friends and Family: Twice a week   Frequency of Social Gatherings with Friends and Family: More than three times a week   Attends Religious Services: Never   Marine scientist or Organizations: No   Attends Archivist Meetings: Never   Marital Status: Widowed    Activities of Daily Living In your present state of health, do you have any difficulty performing the following activities: 04/15/2021  Hearing? N  Vision?  N  Difficulty concentrating or making decisions? N  Walking or climbing stairs? N  Dressing or bathing? N  Doing errands, shopping? N  Preparing Food and eating ? N  Using the Toilet? N  In the past six months, have you accidently leaked urine? N  Do you have problems with loss of bowel control? N  Managing your Medications? N  Managing your Finances? N  Housekeeping or managing your Housekeeping? N  Some recent data might be hidden  Patient Education/ Literacy How often do you need to have someone help you when you read instructions, pamphlets, or other written materials from your doctor or pharmacy?: 1 - Never What is the last grade level you completed in school?: Bachelor's degree  Exercise Current Exercise Habits: Home exercise routine, Type of exercise: walking, Time (Minutes): 20, Frequency (Times/Week): 2, Weekly Exercise (Minutes/Week): 40, Intensity: Mild, Exercise limited by: None identified  Diet Patient reports consuming 1 meals a day and 1 snack(s) a day Patient reports that her primary diet is: Regular Patient reports that she does have regular access to food.   Depression Screen PHQ 2/9 Scores 04/15/2021 03/14/2019 10/02/2017 02/16/2017 08/21/2016 08/21/2016 04/26/2015  PHQ - 2 Score 0 0 0 0 0 0 3  PHQ- 9 Score - - 0 '2 3 3 6     '$ Fall Risk Fall Risk  04/15/2021 03/14/2019 02/27/2019 10/02/2017 08/21/2016  Falls in the past year? 0 1 (No Data) Yes No  Comment - - Emmi Telephone Survey: data to providers prior to load - -  Number falls in past yr: 0 1 (No Data) 1 -  Comment - - Emmi Telephone Survey Actual Response =  - -  Injury with Fall? 0 1 - Yes -  Risk for fall due to : No Fall Risks Other (Comment) - - -  Risk for fall due to: Comment - dog knocked her down - - -  Follow up Falls evaluation completed Falls prevention discussed - Falls prevention discussed -     Objective:  Margaret [name] seemed alert and oriented and she participated appropriately during our  telephone visit.  Blood Pressure Weight BMI  BP Readings from Last 3 Encounters:  04/13/21 130/80  10/11/20 123/84  01/27/20 (!) 143/95   Wt Readings from Last 3 Encounters:  04/13/21 161 lb (73 kg)  10/11/20 162 lb (73.5 kg)  01/27/20 164 lb 8 oz (74.6 kg)   BMI Readings from Last 1 Encounters:  04/13/21 25.22 kg/m    *Unable to obtain current vital signs, weight, and BMI due to telephone visit type  Hearing/Vision  Margaret [name] did not seem to have difficulty with hearing/understanding during the telephone conversation Reports that she has not had a formal eye exam by an eye care professional within the past year Reports that she has not had a formal hearing evaluation within the past year *Unable to fully assess hearing and vision during telephone visit type  Cognitive Function: 6CIT Screen 04/15/2021  What Year? 0 points  What month? 0 points  What time? 0 points  Count back from 20 0 points  Months in reverse 0 points  Repeat phrase 0 points  Total Score 0   (Normal:0-7, Significant for Dysfunction: >8)  Normal Cognitive Function Screening: Yes   Immunization & Health Maintenance Record Immunization History  Administered Date(s) Administered   Influenza Whole 05/11/2008, 04/30/2009, 03/23/2011   Pneumococcal Conjugate-13 04/26/2015   Pneumococcal Polysaccharide-23 08/21/2016   Td 07/31/2000, 10/05/2010   Zoster, Live 10/05/2010    Health Maintenance  Topic Date Due   INFLUENZA VACCINE  10/28/2021 (Originally 02/28/2021)   TETANUS/TDAP  04/13/2022 (Originally 10/04/2020)   MAMMOGRAM  04/15/2022 (Originally 09/04/2020)   Zoster Vaccines- Shingrix (1 of 2) 07/13/2022 (Originally 04/25/2000)   COVID-19 Vaccine (1) 04/14/2023 (Originally 10/24/1951)   DEXA SCAN  Completed   Hepatitis C Screening  Completed   PNA vac Low Risk Adult  Completed   HPV VACCINES  Aged Out   Fecal DNA (Cologuard)  Discontinued       Assessment  This is a routine wellness examination for Margaret [name].  Health Maintenance: Due or Overdue There are no preventive care reminders to display for this patient.   Margaret [name] does not need a referral for Community Assistance: Care Management:   no Social Work:    no Prescription Assistance:  no Nutrition/Diabetes Education:  no   Plan:  Personalized Goals  Goals Addressed               This Visit's Progress     Patient Stated (pt-stated)        04/15/2021 AWV Goal: Improved Nutrition/Diet  Patient will verbalize understanding that diet plays an important role in overall health and that a poor diet is a risk factor for many chronic medical conditions.  Over the next year, patient will improve self management of their diet by incorporating better variety, improved meal pattern and incorporating more water. Patient will utilize available community resources to help with food acquisition if needed (ex: food pantries, [address], etc) Patient will work with nutrition specialist if a referral was made        Personalized Health Maintenance & Screening Recommendations  Influenza vaccine Td vaccine Screening mammography Bone densitometry screening Shingles vaccine Covid vaccine  Patient declined the flu and COVID vaccine.   Lung Cancer Screening Recommended: no (Low Dose CT Chest recommended if Age 65-80 years, 30 pack-year currently smoking OR have quit w/in past 15 years) Hepatitis C Screening recommended: no HIV Screening recommended: no  Advanced Directives: Written information was not prepared per patient's request.  Referrals & Orders No orders of the defined types were placed in this encounter.   Follow-up Plan Follow-up with Hali Marry, MD as planned Let us know when you get the tetanus and the shingles vaccine. Both of them can be done at the pharmacy. Referral for your mammogram and bone density has been sent and they will call you to schedule. Medicare wellness visit in one year.   Patient will access AVS on mychart.   I have personally reviewed and noted the following in the patient's chart:   Medical and social history Use of alcohol, tobacco or illicit drugs  Current medications and supplements Functional ability and status Nutritional status Physical activity Advanced directives List of other physicians Hospitalizations, surgeries, and ER visits in previous 12 months Vitals Screenings to include cognitive, depression, and falls Referrals and appointments  In addition, I have reviewed and discussed with Margaret [name] certain preventive protocols, quality metrics, and best practice recommendations. A written personalized care plan for preventive services as well as general preventive health recommendations is available and can be mailed to the patient at her request.      Tinnie Gens, RN  04/15/2021

## 2021-04-15 NOTE — Patient Instructions (Addendum)
[facility] Maintenance Summary and Written Plan of Care  Margaret [name] ,  Thank you for allowing me to perform your Medicare Annual Wellness Visit and for your ongoing commitment to your health.   Health Maintenance & Immunization History Health Maintenance  Topic Date Due  . INFLUENZA VACCINE  10/28/2021 (Originally 02/28/2021)  . TETANUS/TDAP  04/13/2022 (Originally 10/04/2020)  . MAMMOGRAM  04/15/2022 (Originally 09/04/2020)  . Zoster Vaccines- Shingrix (1 of 2) 07/13/2022 (Originally 04/25/2000)  . COVID-19 Vaccine (1) 04/14/2023 (Originally 10/24/1951)  . DEXA SCAN  Completed  . Hepatitis C Screening  Completed  . PNA vac Low Risk Adult  Completed  . HPV VACCINES  Aged Out  . Fecal DNA (Cologuard)  Discontinued   Immunization History  Administered Date(s) Administered  . Influenza Whole 05/11/2008, 04/30/2009, 03/23/2011  . Pneumococcal Conjugate-13 04/26/2015  . Pneumococcal Polysaccharide-23 08/21/2016  . Td 07/31/2000, 10/05/2010  . Zoster, Live 10/05/2010    These are the patient goals that we discussed:  Goals Addressed              This Visit's Progress   .  Patient Stated (pt-stated)        04/15/2021 AWV Goal: Improved Nutrition/Diet  Patient will verbalize understanding that diet plays an important role in overall health and that a poor diet is a risk factor for many chronic medical conditions.  Over the next year, patient will improve self management of their diet by incorporating better variety, improved meal pattern and incorporating more water. Patient will utilize available community resources to help with food acquisition if needed (ex: food pantries, [address], etc) Patient will work with nutrition specialist if a referral was made         This is a list of Health Maintenance Items that are overdue or due now: Influenza vaccine Td vaccine Screening mammography Bone densitometry screening Shingles vaccine Covid  vaccine  Patient declined the flu and COVID vaccine.   Orders/Referrals Placed Today: No orders of the defined types were placed in this encounter.  (Contact our referral department at [phone] if you have not spoken with someone about your referral appointment within the next 5 days)    Follow-up Plan Follow-up with Hali Marry, MD as planned Let us know when you get the tetanus and the shingles vaccine. Both of them can be done at the pharmacy. Referral for your mammogram and bone density has been sent and they will call you to schedule. Medicare wellness visit in one year.  Patient will access AVS on mychart.      Health Maintenance, Female Adopting a healthy lifestyle and getting preventive care are important in promoting health and wellness. Ask your health care provider about: The right schedule for you to have regular tests and exams. Things you can do on your own to prevent diseases and keep yourself healthy. What should I know about diet, weight, and exercise? Eat a healthy diet  Eat a diet that includes plenty of vegetables, fruits, low-fat dairy products, and lean protein. Do not eat a lot of foods that are high in solid fats, added sugars, or sodium. Maintain a healthy weight Body mass index (BMI) is used to identify weight problems. It estimates body fat based on height and weight. Your health care provider can help determine your BMI and help you achieve or maintain a healthy weight. Get regular exercise Get regular exercise. This is one of the most important things you can do for your  health. Most adults should: Exercise for at least 150 minutes each week. The exercise should increase your heart rate and make you sweat (moderate-intensity exercise). Do strengthening exercises at least twice a week. This is in addition to the moderate-intensity exercise. Spend less time sitting. Even light physical activity can be beneficial. Watch cholesterol and blood  lipids Have your blood tested for lipids and cholesterol at 71 years of age, then have this test every 5 years. Have your cholesterol levels checked more often if: Your lipid or cholesterol levels are high. You are older than 71 years of age. You are at high risk for heart disease. What should I know about cancer screening? Depending on your health history and family history, you may need to have cancer screening at various ages. This may include screening for: Breast cancer. Cervical cancer. Colorectal cancer. Skin cancer. Lung cancer. What should I know about heart disease, diabetes, and high blood pressure? Blood pressure and heart disease High blood pressure causes heart disease and increases the risk of stroke. This is more likely to develop in people who have high blood pressure readings, are of African descent, or are overweight. Have your blood pressure checked: Every 3-5 years if you are 34-36 years of age. Every year if you are 63 years old or older. Diabetes Have regular diabetes screenings. This checks your fasting blood sugar level. Have the screening done: Once every three years after age 84 if you are at a normal weight and have a low risk for diabetes. More often and at a younger age if you are overweight or have a high risk for diabetes. What should I know about preventing infection? Hepatitis B If you have a higher risk for hepatitis B, you should be screened for this virus. Talk with your health care provider to find out if you are at risk for hepatitis B infection. Hepatitis C Testing is recommended for: Everyone born from [birth year] through 1966. Anyone with known risk factors for hepatitis C. Sexually transmitted infections (STIs) Get screened for STIs, including gonorrhea and chlamydia, if: You are sexually active and are younger than 71 years of age. You are older than 71 years of age and your health care provider tells you that you are at risk for this type of  infection. Your sexual activity has changed since you were last screened, and you are at increased risk for chlamydia or gonorrhea. Ask your health care provider if you are at risk. Ask your health care provider about whether you are at high risk for HIV. Your health care provider may recommend a prescription medicine to help prevent HIV infection. If you choose to take medicine to prevent HIV, you should first get tested for HIV. You should then be tested every 3 months for as long as you are taking the medicine. Pregnancy If you are about to stop having your period (premenopausal) and you may become pregnant, seek counseling before you get pregnant. Take 400 to 800 micrograms (mcg) of folic acid every day if you become pregnant. Ask for birth control (contraception) if you want to prevent pregnancy. Osteoporosis and menopause Osteoporosis is a disease in which the bones lose minerals and strength with aging. This can result in bone fractures. If you are 62 years old or older, or if you are at risk for osteoporosis and fractures, ask your health care provider if you should: Be screened for bone loss. Take a calcium or vitamin D supplement to lower your risk of fractures. Be  given hormone replacement therapy (HRT) to treat symptoms of menopause. Follow these instructions at home: Lifestyle Do not use any products that contain nicotine or tobacco, such as cigarettes, e-cigarettes, and chewing tobacco. If you need help quitting, ask your health care provider. Do not use street drugs. Do not share needles. Ask your health care provider for help if you need support or information about quitting drugs. Alcohol use Do not drink alcohol if: Your health care provider tells you not to drink. You are pregnant, may be pregnant, or are planning to become pregnant. If you drink alcohol: Limit how much you use to 0-1 drink a day. Limit intake if you are breastfeeding. Be aware of how much alcohol is in  your drink. In the [place] one drink equals one 12 oz bottle of beer (355 mL), one 5 oz glass of wine (148 mL), or one 1 oz glass of hard liquor (44 mL). General instructions Schedule regular health, dental, and eye exams. Stay current with your vaccines. Tell your health care provider if: You often feel depressed. You have ever been abused or do not feel safe at home. Summary Adopting a healthy lifestyle and getting preventive care are important in promoting health and wellness. Follow your health care provider's instructions about healthy diet, exercising, and getting tested or screened for diseases. Follow your health care provider's instructions on monitoring your cholesterol and blood pressure. This information is not intended to replace advice given to you by your health care provider. Make sure you discuss any questions you have with your health care provider. Document Revised: 09/24/2020 Document Reviewed: 07/10/2018 Elsevier Patient Education  2022 [employer].

## 2021-04-29 ENCOUNTER — Ambulatory Visit (HOSPITAL_BASED_OUTPATIENT_CLINIC_OR_DEPARTMENT_OTHER): Admission: RE | Admit: 2021-04-29 | Payer: Medicare Other | Source: Ambulatory Visit

## 2021-05-11 ENCOUNTER — Ambulatory Visit (INDEPENDENT_AMBULATORY_CARE_PROVIDER_SITE_OTHER): Payer: Medicare Other

## 2021-05-11 ENCOUNTER — Other Ambulatory Visit: Payer: Self-pay

## 2021-05-11 DIAGNOSIS — Z1231 Encounter for screening mammogram for malignant neoplasm of breast: Secondary | ICD-10-CM | POA: Diagnosis not present

## 2021-05-11 DIAGNOSIS — M8589 Other specified disorders of bone density and structure, multiple sites: Secondary | ICD-10-CM | POA: Diagnosis not present

## 2021-05-11 DIAGNOSIS — Z780 Asymptomatic menopausal state: Secondary | ICD-10-CM | POA: Diagnosis not present

## 2021-05-13 ENCOUNTER — Ambulatory Visit (HOSPITAL_BASED_OUTPATIENT_CLINIC_OR_DEPARTMENT_OTHER)
Admission: RE | Admit: 2021-05-13 | Discharge: 2021-05-13 | Disposition: A | Discharge status: Home / Self Care | Payer: Medicare Other | Source: Ambulatory Visit | Attending: Family Medicine | Admitting: Family Medicine

## 2021-05-13 ENCOUNTER — Other Ambulatory Visit: Payer: Self-pay

## 2021-05-13 DIAGNOSIS — R011 Cardiac murmur, unspecified: Secondary | ICD-10-CM | POA: Diagnosis not present

## 2021-05-13 NOTE — Progress Notes (Signed)
On density shows that you have mildly thin bones called osteopenia.  T score -1.8.  Please continue with your prescription alendronate in addition to the recommendations below.   The current recommendation for osteopenia (mildly thin bones) treatment includes:   #1 calcium-total of 1200 mg of calcium daily.  If you eat a very calcium rich diet you may be able to obtain that without a supplement.  If not, then I recommend calcium 500 mg twice a day.  There are several products over-the-counter such as Caltrate D and Viactiv chews which are great options that contain calcium and vitamin D. #2 vitamin D-recommend 800 international units daily. #3 exercise-recommend 30 minutes of weightbearing exercise 3 days a week.  Resistance training ,such as doing bands and light weights, can be particularly helpful.

## 2021-05-15 LAB — ECHOCARDIOGRAM COMPLETE
AR max vel: 2.12 cm2
AV Area VTI: 2.24 cm2
AV Area mean vel: 2.18 cm2
AV Mean grad: 7 mmHg
AV Peak grad: 12 mmHg
Ao pk vel: 1.73 m/s
Area-P 1/2: 2.21 cm2
Calc EF: 75.5 %
MV VTI: 2.75 cm2
S' Lateral: 2 cm
Single Plane A2C EF: 77.6 %
Single Plane A4C EF: 73.5 %

## 2021-05-16 NOTE — Progress Notes (Signed)
Please call patient. Normal mammogram.  Repeat in 1 year.  

## 2021-05-19 NOTE — Progress Notes (Signed)
Hi [name], echocardiogram of the heart shows good pumping function with an EF of 60 to 65% which is considered normal.  The walls of the heart are moving normally as well.  There is a little bit of enlargement of the left ventricle.  This is most commonly from high blood pressure.  We just want a make sure that we are doing her best job to control the blood pressure so it does not put extra strain on the heart.  There is just a little bit of slight abnormal relaxation of the heart but nothing worrisome.  Valves look good and appear to be working well.  Overall the heart looks good.  Again just need to make sure that were controlling the blood pressure.  Also please try to get your labs done as soon as possible.  Its been over a year since I have up-to-date blood work on you.  Have a wonderful weekend

## 2021-06-06 IMAGING — DX DG WRIST COMPLETE 3+V*L*
4 series · 4 of 4 positions shown · non-contrast
Comparison: Multiple exams the most recent of which is 04/17/2019

CLINICAL DATA: Follow-up distal left radial fracture

EXAM:
LEFT WRIST - COMPLETE 3+ VIEW

[wrist pa]
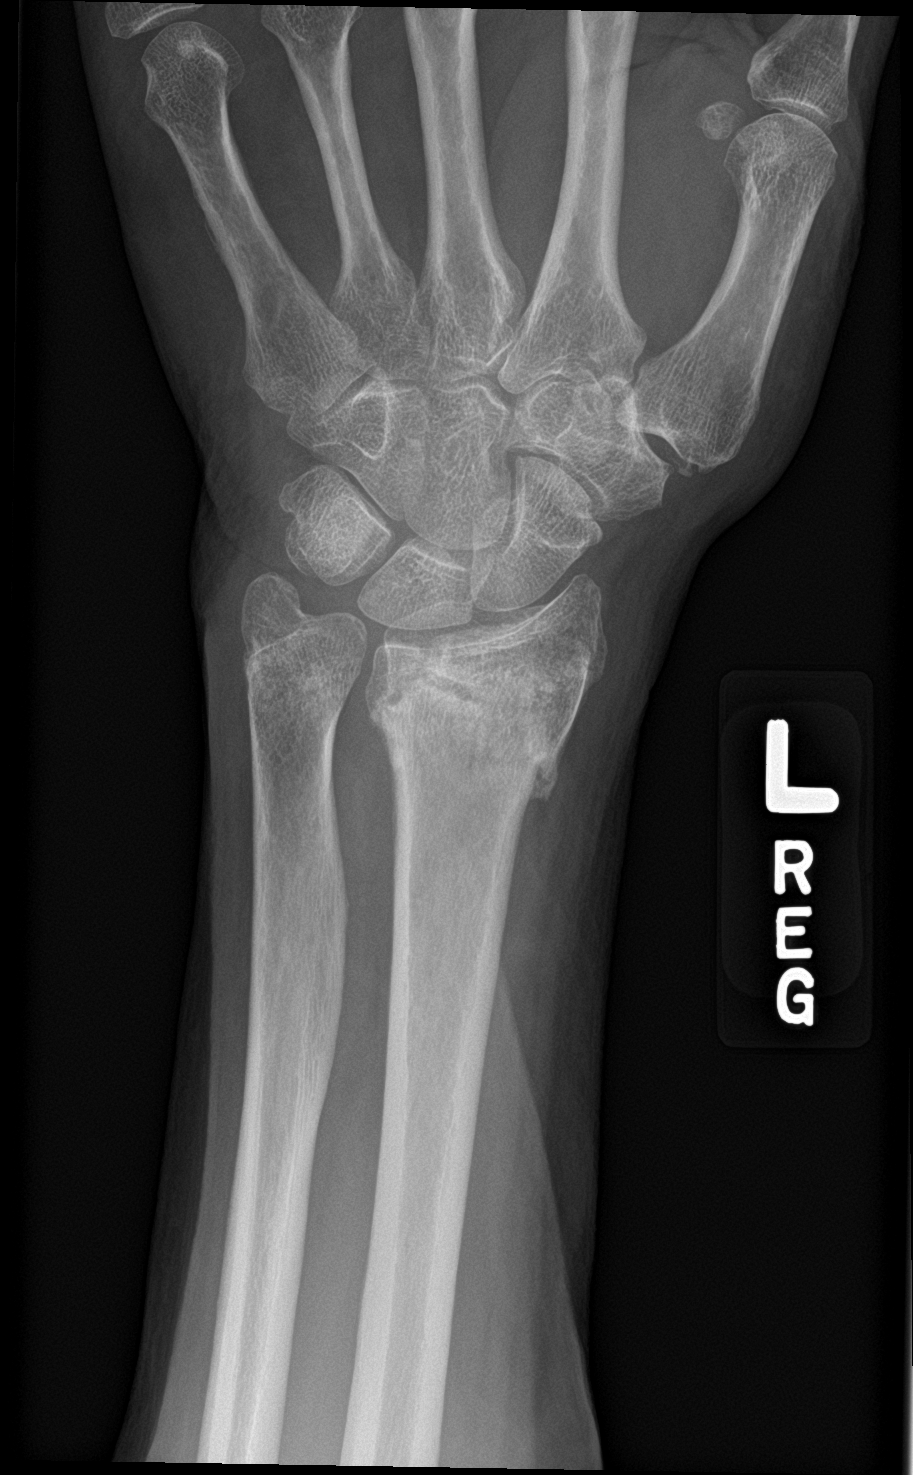

[wrist obl]
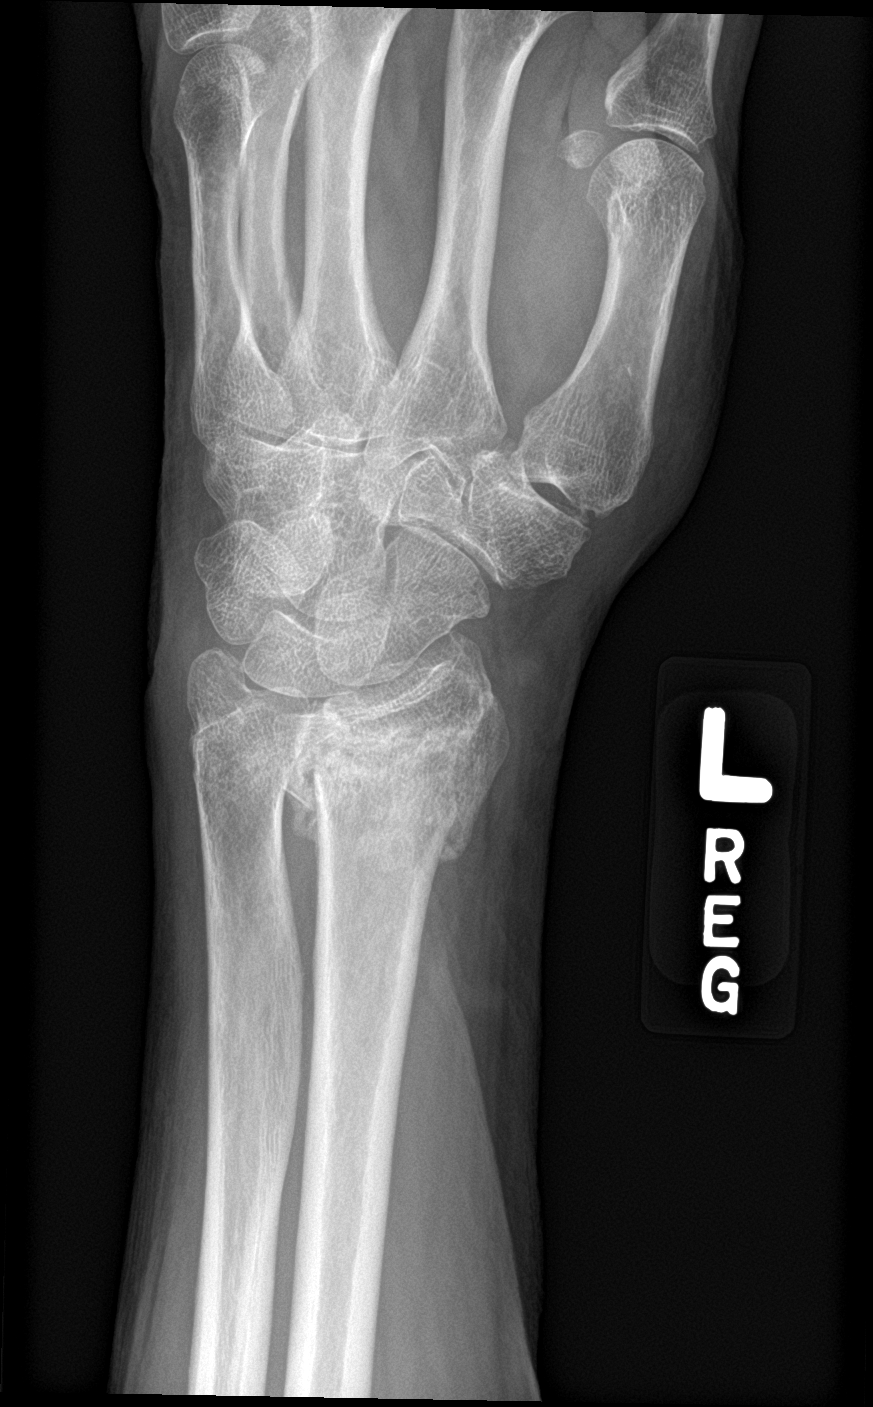

[wrist lat]
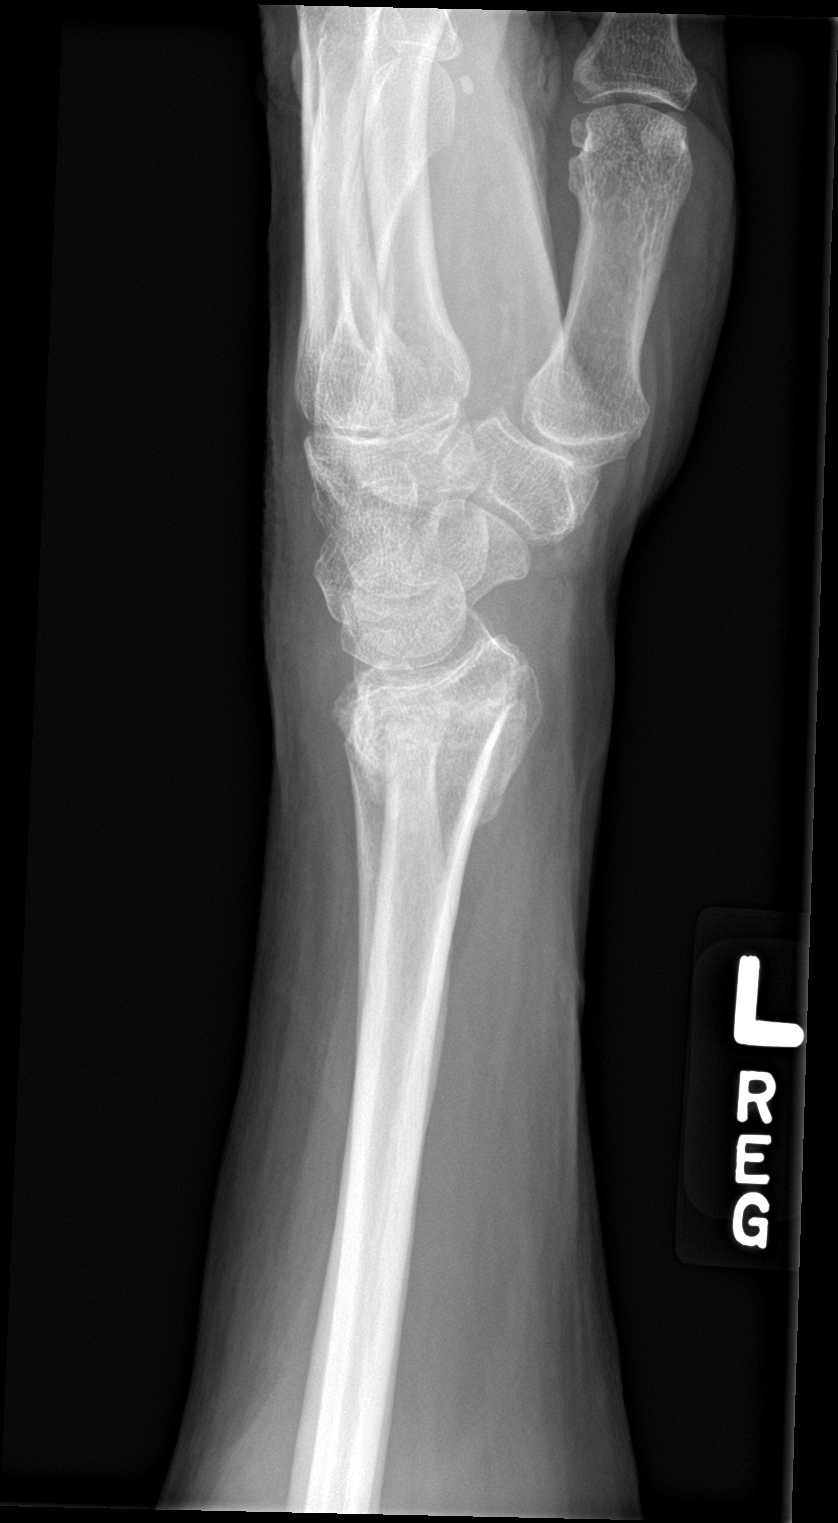

[wrist navicular]
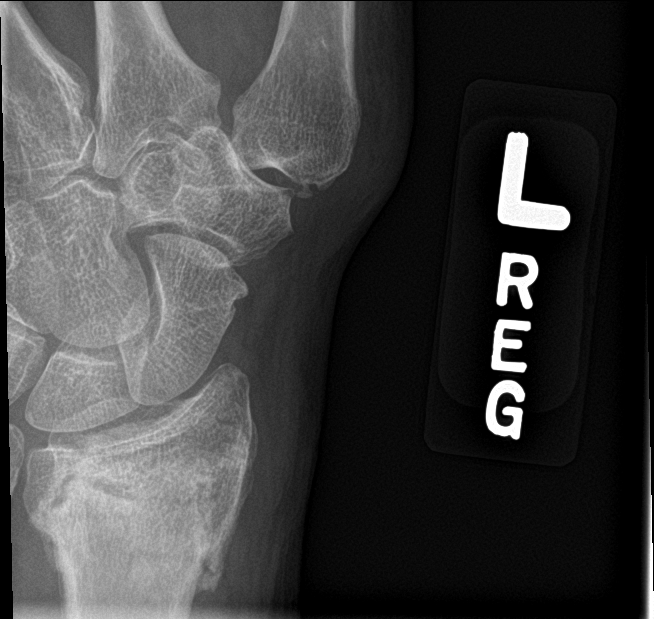

[4 of 4 positions shown; findings below may reference images not displayed]

FINDINGS: Distal radial fracture is again identified with intra-articular
involvement. Callus formation is seen. This has increased slightly
in the interval from the prior exam. Persistent dorsal angulation is
noted of the distal radial fracture fragment. No new fracture is
noted. The previously seen ulnar styloid fracture is not well
appreciated.
IMPRESSION: Interval healing in the distal radial fracture as described no new
focal abnormality is seen.

## 2021-08-08 ENCOUNTER — Other Ambulatory Visit: Payer: Self-pay | Admitting: Family Medicine

## 2021-08-08 DIAGNOSIS — I10 Essential (primary) hypertension: Secondary | ICD-10-CM

## 2021-08-08 DIAGNOSIS — E785 Hyperlipidemia, unspecified: Secondary | ICD-10-CM

## 2021-08-17 ENCOUNTER — Other Ambulatory Visit: Payer: Self-pay | Admitting: Family Medicine

## 2021-08-17 DIAGNOSIS — I10 Essential (primary) hypertension: Secondary | ICD-10-CM

## 2021-10-11 ENCOUNTER — Other Ambulatory Visit: Payer: Self-pay

## 2021-10-11 ENCOUNTER — Encounter: Payer: Self-pay | Admitting: Family Medicine

## 2021-10-11 ENCOUNTER — Ambulatory Visit (INDEPENDENT_AMBULATORY_CARE_PROVIDER_SITE_OTHER): Payer: Medicare Other | Admitting: Family Medicine

## 2021-10-11 VITALS — BP 112/68 | HR 60 | Resp 20 | Ht 67.0 in | Wt 159.0 lb

## 2021-10-11 DIAGNOSIS — M810 Age-related osteoporosis without current pathological fracture: Secondary | ICD-10-CM | POA: Diagnosis not present

## 2021-10-11 DIAGNOSIS — M159 Polyosteoarthritis, unspecified: Secondary | ICD-10-CM | POA: Diagnosis not present

## 2021-10-11 DIAGNOSIS — E785 Hyperlipidemia, unspecified: Secondary | ICD-10-CM

## 2021-10-11 DIAGNOSIS — E559 Vitamin D deficiency, unspecified: Secondary | ICD-10-CM | POA: Diagnosis not present

## 2021-10-11 DIAGNOSIS — F339 Major depressive disorder, recurrent, unspecified: Secondary | ICD-10-CM

## 2021-10-11 DIAGNOSIS — E348 Other specified endocrine disorders: Secondary | ICD-10-CM | POA: Diagnosis not present

## 2021-10-11 DIAGNOSIS — N1831 Chronic kidney disease, stage 3a: Secondary | ICD-10-CM | POA: Diagnosis not present

## 2021-10-11 DIAGNOSIS — I10 Essential (primary) hypertension: Secondary | ICD-10-CM

## 2021-10-11 MED ORDER — ALENDRONATE SODIUM 70 MG PO TABS: ORAL_TABLET | ORAL | 4 refills | Status: DC

## 2021-10-11 MED ORDER — FLUOXETINE HCL 10 MG PO CAPS: 10.0000 mg | ORAL_CAPSULE | Freq: Every day | ORAL | 3 refills | Status: DC

## 2021-10-11 MED ORDER — HYDROCHLOROTHIAZIDE 12.5 MG PO CAPS: ORAL_CAPSULE | ORAL | 3 refills | Status: DC

## 2021-10-11 MED ORDER — SIMVASTATIN 40 MG PO TABS: ORAL_TABLET | ORAL | 3 refills | Status: DC

## 2021-10-11 MED ORDER — MELOXICAM 15 MG PO TABS: ORAL_TABLET | ORAL | 1 refills | Status: DC

## 2021-10-11 NOTE — Assessment & Plan Note (Signed)
Overall doing okay.  Did have her complete a PHQ-9 today. ?

## 2021-10-11 NOTE — Assessment & Plan Note (Signed)
Well controlled. Continue current regimen. Follow up in  6 mo  

## 2021-10-11 NOTE — Assessment & Plan Note (Signed)
Due to recheck lipids. 

## 2021-10-11 NOTE — Progress Notes (Signed)
? ?Established Patient Office Visit ? ?Subjective:  ?Patient ID: Margaret [name], female    DOB: [birth year]  Age: 71 y.o. MRN: [id number] ? ?CC:  ?Chief Complaint  ?Patient presents with  ? Hypertension  ? ? ?HPI ?[name] presents for  ? ?Hypertension- Pt denies chest pain, SOB, dizziness, or heart palpitations.  Taking meds as directed w/o problems.  Denies medication side effects.  Currently on hydrochlorothiazide 12.5 mg. ? ?F/U CKD 3 - no recent changes.  ? ?F/U osteoporosis  - currently on Fosamax and calcium with vitramin D.  .  Tolerating medication well without any side effects or problems. ? ?Past Medical History:  ?Diagnosis Date  ? Cancer [facility])   ? skin cancer-leg  ? GERD (gastroesophageal reflux disease)   ? Hyperlipidemia   ? Hypertension   ? Impaired fasting glucose   ? ? ?Past Surgical History:  ?Procedure Laterality Date  ? bladder tack    ? CYSTOCELE REPAIR    ? PILONIDAL CYST EXCISION    ? VAGINAL HYSTERECTOMY    ? ? ?Family History  ?Problem Relation Age of Onset  ? Stomach cancer Father 29  ?     deceased  ? Diabetes Father   ? Cancer Father   ?     stomach  ? Bell's palsy Mother   ? Parkinsonism Mother   ?     died age 53  ? Dementia Mother   ? Asthma Mother   ? Asthma Other   ? Depression Other   ? Bipolar disorder Other   ? Diabetes Sister   ? Diabetes Brother   ? Colon polyps Brother   ? Colon cancer Neg Hx   ? Esophageal cancer Neg Hx   ? ? ?Social History  ? ?Socioeconomic History  ? Marital status: Widowed  ?  Spouse name: Not on file  ? Number of children: 2  ? Years of education: 18  ? Highest education level: Bachelor's degree (e.g., BA, AB, BS)  ?Occupational History  ? Occupation: Retired  ?Tobacco Use  ? Smoking status: Former  ?  Types: Cigarettes  ? Smokeless tobacco: Never  ?Vaping Use  ? Vaping Use: Never used  ?Substance and Sexual Activity  ? Alcohol use: No  ? Drug use: No  ? Sexual activity: Not on file  ?Other Topics Concern  ? Not on file  ?Social History Narrative   ? Lives with her son. They live near her daughter. She enjoys reading, playing the piano and spending time with her grandson.  ? ?Social Determinants of Health  ? ?Financial Resource Strain: Low Risk   ? Difficulty of Paying Living Expenses: Not hard at all  ?Food Insecurity: No Food Insecurity  ? Worried About Charity fundraiser in the Last Year: Never true  ? Ran Out of Food in the Last Year: Never true  ?Transportation Needs: No Transportation Needs  ? Lack of Transportation (Medical): No  ? Lack of Transportation (Non-Medical): No  ?Physical Activity: Inactive  ? Days of Exercise per Week: 0 days  ? Minutes of Exercise per Session: 0 min  ?Stress: No Stress Concern Present  ? Feeling of Stress : Not at all  ?Social Connections: Socially Isolated  ? Frequency of Communication with Friends and Family: Twice a week  ? Frequency of Social Gatherings with Friends and Family: More than three times a week  ? Attends Religious Services: Never  ? Active Member of Clubs or Organizations: No  ?  Attends Archivist Meetings: Never  ? Marital Status: Widowed  ?Intimate Partner Violence: Not At Risk  ? Fear of Current or Ex-Partner: No  ? Emotionally Abused: No  ? Physically Abused: No  ? Sexually Abused: No  ? ? ?Outpatient Medications Prior to Visit  ?Medication Sig Dispense Refill  ? Calcium Carbonate-Vit D-Min (CALCIUM 1200 PO) Take 2 capsules by mouth daily.    ? Cetirizine HCl 10 MG CAPS Take 1 capsule by mouth daily.    ? Multiple Vitamins-Minerals (CENTRUM SILVER ADULT 50+ PO) Take 1 capsule by mouth daily.    ? ranitidine (ZANTAC) 150 MG tablet Take 150 mg by mouth daily.    ? alendronate (FOSAMAX) 70 MG tablet TAKE 1 TABLET WEEKLY TAKE ON EMPTY STOMACH WITH 8 OZ OF WATER(DO NOT LIE DOWN FOR 30 MINUTES) 12 tablet 4  ? FLUoxetine (PROZAC) 10 MG capsule Take 1 capsule (10 mg total) by mouth daily. 90 capsule 1  ? hydrochlorothiazide (MICROZIDE) 12.5 MG capsule TAKE 1 CAPSULE(12.5 MG) BY MOUTH DAILY 90  capsule 0  ? meloxicam (MOBIC) 15 MG tablet One tab PO qAM with breakfast for 2 weeks, then daily prn pain. 30 tablet 2  ? simvastatin (ZOCOR) 40 MG tablet TAKE 1 TABLET(40 MG) BY MOUTH AT BEDTIME 90 tablet 0  ? ?No facility-administered medications prior to visit.  ? ? ?Allergies  ?Allergen Reactions  ? Diphenhydramine Hcl Swelling and Other (See Comments)  ? Codeine Nausea And Vomiting  ? Fish Oil Other (See Comments)  ?  REACTION: vomiting.  ? ? ?ROS ?Review of Systems ? ?  ?Objective:  ?  ?Physical Exam ?Constitutional:   ?   Appearance: Normal appearance. She is well-developed.  ?HENT:  ?   Head: Normocephalic and atraumatic.  ?Cardiovascular:  ?   Rate and Rhythm: Normal rate and regular rhythm.  ?   Heart sounds: Normal heart sounds.  ?Pulmonary:  ?   Effort: Pulmonary effort is normal.  ?   Breath sounds: Normal breath sounds.  ?Skin: ?   General: Skin is warm and dry.  ?Neurological:  ?   Mental Status: She is alert and oriented to person, place, and time.  ?Psychiatric:     ?   Behavior: Behavior normal.  ? ? ?BP 112/68 (BP Location: Left Arm, Cuff Size: Normal)   Pulse 60   Resp 20   Ht '5\' 7"'$  (1.702 m)   Wt 159 lb (72.1 kg)   SpO2 97%   BMI 24.90 kg/m?  ?Wt Readings from Last 3 Encounters:  ?10/11/21 159 lb (72.1 kg)  ?04/13/21 161 lb (73 kg)  ?10/11/20 162 lb (73.5 kg)  ? ? ? ?There are no preventive care reminders to display for this patient. ? ?There are no preventive care reminders to display for this patient. ? ?Lab Results  ?Component Value Date  ? TSH 2.994 08/22/2013  ? ?Lab Results  ?Component Value Date  ? WBC 5.8 11/08/2010  ? HGB 14.6 11/08/2010  ? HCT 42.9 11/08/2010  ? MCV 91.1 11/08/2010  ? PLT 251 11/08/2010  ? ?Lab Results  ?Component Value Date  ? NA 144 03/20/2019  ? K 4.3 03/20/2019  ? CO2 28 03/20/2019  ? GLUCOSE 122 (H) 03/20/2019  ? BUN 21 03/20/2019  ? CREATININE 1.20 (H) 03/20/2019  ? BILITOT 0.6 08/23/2018  ? ALKPHOS 60 09/08/2016  ? AST 20 08/23/2018  ? ALT 28 08/23/2018   ? PROT 7.0 08/23/2018  ? ALBUMIN 4.3 09/08/2016  ?  CALCIUM 9.6 03/20/2019  ? ?Lab Results  ?Component Value Date  ? CHOL 187 03/20/2019  ? ?Lab Results  ?Component Value Date  ? HDL 44 (L) 03/20/2019  ? ?Lab Results  ?Component Value Date  ? LDLCALC 118 (H) 03/20/2019  ? ?Lab Results  ?Component Value Date  ? TRIG 142 03/20/2019  ? ?Lab Results  ?Component Value Date  ? CHOLHDL 4.3 03/20/2019  ? ?Lab Results  ?Component Value Date  ? HGBA1C 5.4 03/14/2019  ? ? ?  ?Assessment & Plan:  ? ?Problem List Items Addressed This Visit   ? ?  ? Cardiovascular and Mediastinum  ? Essential hypertension - Primary  ?  Well controlled. Continue current regimen. Follow up in  6 mo  ?  ?  ? Relevant Medications  ? hydrochlorothiazide (MICROZIDE) 12.5 MG capsule  ? simvastatin (ZOCOR) 40 MG tablet  ?  ? Musculoskeletal and Integument  ? Osteoporosis  ? Relevant Medications  ? alendronate (FOSAMAX) 70 MG tablet  ? Osteoarthritis  ? Relevant Medications  ? meloxicam (MOBIC) 15 MG tablet  ?  ? Genitourinary  ? CKD (chronic kidney disease) stage 3, GFR 30-59 ml/min (HCC)  ?  ? Other  ? Hyperlipidemia  ?  Due to recheck lipids.   ?  ?  ? Relevant Medications  ? hydrochlorothiazide (MICROZIDE) 12.5 MG capsule  ? simvastatin (ZOCOR) 40 MG tablet  ? Depression, recurrent ([place])  ?  Overall doing okay.  Did have her complete a PHQ-9 today. ?  ?  ? Relevant Medications  ? FLUoxetine (PROZAC) 10 MG capsule  ? ? ?Meds ordered this encounter  ?Medications  ? alendronate (FOSAMAX) 70 MG tablet  ?  Sig: TAKE 1 TABLET WEEKLY TAKE ON EMPTY STOMACH WITH 8 OZ OF WATER(DO NOT LIE DOWN FOR 30 MINUTES)  ?  Dispense:  12 tablet  ?  Refill:  4  ? FLUoxetine (PROZAC) 10 MG capsule  ?  Sig: Take 1 capsule (10 mg total) by mouth daily.  ?  Dispense:  90 capsule  ?  Refill:  3  ? hydrochlorothiazide (MICROZIDE) 12.5 MG capsule  ?  Sig: TAKE 1 CAPSULE(12.5 MG) BY MOUTH DAILY  ?  Dispense:  90 capsule  ?  Refill:  3  ? meloxicam (MOBIC) 15 MG tablet  ?  Sig: One  tab PO qAM with breakfast for 2 weeks, then daily prn pain.  ?  Dispense:  90 tablet  ?  Refill:  1  ? simvastatin (ZOCOR) 40 MG tablet  ?  Sig: TAKE 1 TABLET(40 MG) BY MOUTH AT BEDTIME  ?  Dispense:  90 tablet

## 2021-10-12 LAB — MICROALBUMIN / CREATININE URINE RATIO
Creatinine, Urine: 136 mg/dL (ref 20–275)
Microalb Creat Ratio: 13 mcg/mg creat (ref <30)
Microalb, Ur: 1.7 mg/dL

## 2021-10-12 LAB — LIPID PANEL W/REFLEX DIRECT LDL
Cholesterol: 165 mg/dL (ref <200)
HDL: 53 mg/dL (ref >=50)
LDL Cholesterol (Calc): 89 mg/dL (calc)
Non-HDL Cholesterol (Calc): 112 mg/dL (calc) (ref <130)
Total CHOL/HDL Ratio: 3.1 (calc) (ref <5.0)
Triglycerides: 124 mg/dL (ref <150)

## 2021-10-12 LAB — COMPLETE METABOLIC PANEL WITH GFR
AG Ratio: 2 (calc) (ref 1.0–2.5)
ALT: 10 U/L (ref 6–29)
AST: 11 U/L (ref 10–35)
Albumin: 4.6 g/dL (ref 3.6–5.1)
Alkaline phosphatase (APISO): 61 U/L (ref 37–153)
BUN: 14 mg/dL (ref 7–25)
CO2: 30 mmol/L (ref 20–32)
Calcium: 9.3 mg/dL (ref 8.6–10.4)
Chloride: 107 mmol/L (ref 98–110)
Creat: 0.95 mg/dL (ref 0.60–1.00)
Globulin: 2.3 g/dL (calc) (ref 1.9–3.7)
Glucose, Bld: 92 mg/dL (ref 65–99)
Potassium: 4.4 mmol/L (ref 3.5–5.3)
Sodium: 142 mmol/L (ref 135–146)
Total Bilirubin: 0.7 mg/dL (ref 0.2–1.2)
Total Protein: 6.9 g/dL (ref 6.1–8.1)
eGFR: 64 mL/min/{1.73_m2} (ref >=60)

## 2021-10-12 LAB — VITAMIN D 25 HYDROXY (VIT D DEFICIENCY, FRACTURES): Vit D, 25-Hydroxy: 23 ng/mL — ABNORMAL LOW (ref 30–100)

## 2022-04-14 ENCOUNTER — Encounter: Payer: Self-pay | Admitting: Family Medicine

## 2022-04-14 ENCOUNTER — Ambulatory Visit (INDEPENDENT_AMBULATORY_CARE_PROVIDER_SITE_OTHER): Payer: Medicare Other | Admitting: Family Medicine

## 2022-04-14 VITALS — BP 148/93 | HR 87 | Wt 161.0 lb

## 2022-04-14 DIAGNOSIS — I10 Essential (primary) hypertension: Secondary | ICD-10-CM | POA: Diagnosis not present

## 2022-04-14 DIAGNOSIS — N1831 Chronic kidney disease, stage 3a: Secondary | ICD-10-CM

## 2022-04-14 DIAGNOSIS — F339 Major depressive disorder, recurrent, unspecified: Secondary | ICD-10-CM

## 2022-04-14 NOTE — Assessment & Plan Note (Addendum)
Pressure uncontrolled we will recheck before she leaves today.  Discussed checking her blood pressure at home.  It looked great when she was here 6 months ago.  She did have a bit of caffeine earlier today.  Have her return in 2 weeks for nurse visit to recheck if at goal then will follow back on 28-monthcycle.

## 2022-04-14 NOTE — Assessment & Plan Note (Addendum)
Continue to follow renal function every 6 months. 

## 2022-04-14 NOTE — Progress Notes (Signed)
   Established Patient Office Visit  Subjective   Patient ID: Margaret [name], female    DOB: November 03, 1950  Age: 71 y.o. MRN: [id number]  Chief Complaint  Patient presents with   Hypertension    F/u    HPI   Hypertension- Pt denies chest pain, SOB, dizziness, or heart palpitations.  Taking meds as directed w/o problems.  Denies medication side effects.    F/U CKD 3 -no recent changes.  D/U MDD -she denies low mood or feeling down or depressed.  No difficulty with concentration or energy.  PHQ-9 score of 0 today.  Doing well on Prozac 10 mg daily.    ROS    Objective:     BP (!) 148/93   Pulse 87   Wt 161 lb (73 kg)   SpO2 97%   BMI 25.22 kg/m    Physical Exam Vitals and nursing note reviewed.  Constitutional:      Appearance: She is well-developed.  HENT:     Head: Normocephalic and atraumatic.  Cardiovascular:     Rate and Rhythm: Normal rate and regular rhythm.     Heart sounds: Murmur heard.     Comments: 2/6 SEM Pulmonary:     Effort: Pulmonary effort is normal.     Breath sounds: Normal breath sounds.  Skin:    General: Skin is warm and dry.  Neurological:     Mental Status: She is alert and oriented to person, place, and time.  Psychiatric:        Behavior: Behavior normal.      No results found for any visits on 04/14/22.    The 10-year ASCVD risk score (Arnett DK, et al., 2019) is: 17.5%    Assessment & Plan:   Problem List Items Addressed This Visit       Cardiovascular and Mediastinum   Essential hypertension - Primary    Pressure uncontrolled we will recheck before she leaves today.  Discussed checking her blood pressure at home.  It looked great when she was here 6 months ago.  She did have a bit of caffeine earlier today.  Have her return in 2 weeks for nurse visit to recheck if at goal then will follow back on 70-monthcycle.      Relevant Orders   BASIC METABOLIC PANEL WITH GFR     Genitourinary   CKD (chronic kidney  disease) stage 3, GFR 30-59 ml/min (HCC)    Continue to follow renal function every 6 months.        Other   [place]ression, recurrent [place]    Remission, doing really well.  Continue current regimen.       Return in about 6 months (around 10/13/2022) for Hypertension.    CBeatrice Lecher MD

## 2022-04-14 NOTE — Assessment & Plan Note (Signed)
Remission, doing really well.  Continue current regimen.

## 2022-04-14 NOTE — Patient Instructions (Addendum)
Come back in 2 weeks for a BP check with the nurse.  You are due for your next colonoscopy in May 2024.  Current you to have your Tdap and your new updated shingles vaccine done at your pharmacy where it should be free under your Medicare part D plan.  If you are able to check your blood pressure at home occasionally that would be wonderful so we can see if it is under control.

## 2022-04-17 ENCOUNTER — Ambulatory Visit (INDEPENDENT_AMBULATORY_CARE_PROVIDER_SITE_OTHER): Payer: Medicare Other | Admitting: Family Medicine

## 2022-04-17 DIAGNOSIS — Z0000 Encounter for general adult medical examination without abnormal findings: Secondary | ICD-10-CM

## 2022-04-17 NOTE — Progress Notes (Signed)
MEDICARE ANNUAL WELLNESS VISIT  04/17/2022  Telephone Visit Disclaimer This Medicare AWV was conducted by telephone due to national recommendations for restrictions regarding the COVID-19 Pandemic (e.g. social distancing).  I verified, using two identifiers, that I am speaking with Margaret [name] or their authorized healthcare agent. I discussed the limitations, risks, security, and privacy concerns of performing an evaluation and management service by telephone and the potential availability of an in-person appointment in the future. The patient expressed understanding and agreed to proceed.  Location of Patient: Home Location of Provider (nurse):  In the office.  Subjective:    Margaret [name] is a 71 y.o. female patient of Metheney, Rene Kocher, MD who had a Medicare Annual Wellness Visit today via telephone. Margaret [name] is Retired and lives with their family. she has 2 children. she reports that she is socially active and does interact with friends/family regularly. she is minimally physically active and enjoys reading, playing the piano and spending time with her grandson.  Patient Care Team: Hali Marry, MD as PCP - General     04/17/2022    3:27 PM 04/15/2021   10:17 AM 12/04/2017   10:24 AM 08/18/2014    2:45 PM  Advanced Directives  Does Patient Have a Medical Advance Directive? Yes Yes No No  Type of Advance Directive Living will Living will;Healthcare Power of Attorney    Does patient want to make changes to medical advance directive? No - Patient declined No - Patient declined    Copy of Nettie in Chart?  No - copy requested    Would patient like information on creating a medical advance directive?    Yes - Educational materials given    Hospital Utilization Over the Past 12 Months: # of hospitalizations or ER visits: 0 # of surgeries: 0  Review of Systems    Patient reports that her overall health is better compared to last  year.  History obtained from chart review and the patient  Patient Reported Readings (BP, Pulse, CBG, Weight, etc) none  Pain Assessment Pain : No/denies pain     Current Medications & Allergies (verified) Allergies as of 04/17/2022       Reactions   Diphenhydramine Hcl Swelling, Other (See Comments)   Codeine Nausea And Vomiting   Fish Oil Other (See Comments)   REACTION: vomiting.        Medication List        Accurate as of April 17, 2022  3:38 PM. If you have any questions, ask your nurse or doctor.          alendronate 70 MG tablet Commonly known as: FOSAMAX TAKE 1 TABLET WEEKLY TAKE ON EMPTY STOMACH WITH 8 OZ OF WATER(DO NOT LIE DOWN FOR 30 MINUTES)   CALCIUM 1200 PO Take 2 capsules by mouth daily.   CENTRUM SILVER ADULT 50+ PO Take 1 capsule by mouth daily.   Cetirizine HCl 10 MG Caps Take 1 capsule by mouth daily.   FLUoxetine 10 MG capsule Commonly known as: PROZAC Take 1 capsule (10 mg total) by mouth daily.   hydrochlorothiazide 12.5 MG capsule Commonly known as: MICROZIDE TAKE 1 CAPSULE(12.5 MG) BY MOUTH DAILY   meloxicam 15 MG tablet Commonly known as: MOBIC One tab PO qAM with breakfast for 2 weeks, then daily prn pain.   ranitidine 150 MG tablet Commonly known as: ZANTAC Take 150 mg by mouth daily.   simvastatin 40 MG tablet Commonly known as: ZOCOR  TAKE 1 TABLET(40 MG) BY MOUTH AT BEDTIME        History (reviewed): Past Medical History:  Diagnosis Date   Cancer ([place])    skin cancer-leg   GERD (gastroesophageal reflux disease)    Hyperlipidemia    Hypertension    Impaired fasting glucose    Past Surgical History:  Procedure Laterality Date   bladder tack     CYSTOCELE REPAIR     PILONIDAL CYST EXCISION     VAGINAL HYSTERECTOMY     Family History  Problem Relation Age of Onset   Stomach cancer Father 45       deceased   Diabetes Father    Cancer Father        stomach   Bell's palsy Mother    Parkinsonism  Mother        died age 26   Dementia Mother    Asthma Mother    Asthma Other    Depression Other    Bipolar disorder Other    Diabetes Sister    Diabetes Brother    Colon polyps Brother    Colon cancer Neg Hx    Esophageal cancer Neg Hx    Social History   Socioeconomic History   Marital status: Widowed    Spouse name: Not on file   Number of children: 2   Years of education: 16   Highest education level: Bachelor's degree (e.g., BA, AB, BS)  Occupational History   Occupation: Retired  Tobacco Use   Smoking status: Former    Types: Cigarettes   Smokeless tobacco: Never  Vaping Use   Vaping Use: Never used  Substance and Sexual Activity   Alcohol use: No   Drug use: No   Sexual activity: Not on file  Other Topics Concern   Not on file  Social History Narrative   Lives with her son. They live near her daughter. She enjoys reading, playing the piano and spending time with her grandson.   Social Determinants of Health   Financial Resource Strain: Low Risk  (04/17/2022)   Overall Financial Resource Strain (CARDIA)    Difficulty of Paying Living Expenses: Not hard at all  Food Insecurity: No Food Insecurity (04/17/2022)   Hunger Vital Sign    Worried About Running Out of Food in the Last Year: Never true    Ran Out of Food in the Last Year: Never true  Transportation Needs: No Transportation Needs (04/17/2022)   PRAPARE - Hydrologist (Medical): No    Lack of Transportation (Non-Medical): No  Physical Activity: Inactive (04/17/2022)   Exercise Vital Sign    Days of Exercise per Week: 0 days    Minutes of Exercise per Session: 0 min  Stress: No Stress Concern Present (04/17/2022)   [place]    Feeling of Stress : Not at all  Social Connections: Socially Isolated (04/17/2022)   Social Connection and Isolation Panel [NHANES]    Frequency of Communication with Friends and  Family: More than three times a week    Frequency of Social Gatherings with Friends and Family: More than three times a week    Attends Religious Services: Never    Marine scientist or Organizations: No    Attends Archivist Meetings: Never    Marital Status: Widowed    Activities of Daily Living    04/17/2022    3:32 PM  In your present  state of health, do you have any difficulty performing the following activities:  Hearing? 1  Comment some hearing loss  Vision? 1  Comment due to have cataract surgery  Difficulty concentrating or making decisions? 0  Walking or climbing stairs? 0  Dressing or bathing? 0  Doing errands, shopping? 0  Preparing Food and eating ? N  Using the Toilet? N  In the past six months, have you accidently leaked urine? N  Do you have problems with loss of bowel control? N  Managing your Medications? N  Managing your Finances? N  Housekeeping or managing your Housekeeping? N    Patient Education/ Literacy How often do you need to have someone help you when you read instructions, pamphlets, or other written materials from your doctor or pharmacy?: 1 - Never What is the last grade level you completed in school?: bachelor's degree  Exercise Current Exercise Habits: Home exercise routine, Type of exercise: stretching, Time (Minutes): 15, Frequency (Times/Week): 7, Weekly Exercise (Minutes/Week): 105, Intensity: Mild, Exercise limited by: None identified  Diet Patient reports consuming 2 small meals a day and 0 snack(s) a day Patient reports that her primary diet is: Regular Patient reports that she does have regular access to food.   Depression Screen    04/17/2022    3:28 PM 04/14/2022    1:43 PM 04/14/2022    1:03 PM 10/11/2021   11:21 AM 04/15/2021   10:17 AM 03/14/2019    2:33 PM 10/02/2017    2:31 PM  PHQ 2/9 Scores  PHQ - 2 Score 0 0 0 0 0 0 0  PHQ- 9 Score 0 0  0   0     Fall Risk    04/17/2022    3:27 PM 04/14/2022    1:03 PM  10/11/2021   11:21 AM 04/15/2021   10:17 AM 03/14/2019    2:33 PM  Margaret [name] in the past year? 0 0 0 0 1  Number falls in past yr: 0 0 0 0 1  Injury with Fall? 0 0 0 0 1  Risk for fall due to : No Fall Risks No Fall Risks No Fall Risks No Fall Risks Other (Comment)  Risk for fall due to: Comment     dog knocked her down  Follow up Falls evaluation completed Falls evaluation completed Falls evaluation completed Falls evaluation completed Falls prevention discussed     Objective:  Margaret [name] seemed alert and oriented and she participated appropriately during our telephone visit.  Blood Pressure Weight BMI  BP Readings from Last 3 Encounters:  04/14/22 (!) 148/93  10/11/21 112/68  04/13/21 130/80   Wt Readings from Last 3 Encounters:  04/14/22 161 lb (73 kg)  10/11/21 159 lb (72.1 kg)  04/13/21 161 lb (73 kg)   BMI Readings from Last 1 Encounters:  04/14/22 25.22 kg/m    *Unable to obtain current vital signs, weight, and BMI due to telephone visit type  Hearing/Vision  Margaret [name] did not seem to have difficulty with hearing/understanding during the telephone conversation Reports that she has not had a formal eye exam by an eye care professional within the past year Reports that she has not had a formal hearing evaluation within the past year *Unable to fully assess hearing and vision during telephone visit type  Cognitive Function:    04/17/2022    3:35 PM 04/15/2021   10:27 AM  6CIT Screen  What Year? 0 points 0 points  What  month? 0 points 0 points  What time? 0 points 0 points  Count back from 20 0 points 0 points  Months in reverse 0 points 0 points  Repeat phrase 0 points 0 points  Total Score 0 points 0 points   (Normal:0-7, Significant for Dysfunction: >8)  Normal Cognitive Function Screening: Yes   Immunization & Health Maintenance Record Immunization History  Administered Date(s) Administered   Influenza Whole 05/11/2008, 04/30/2009, 03/23/2011    Pneumococcal Conjugate-13 04/26/2015   Pneumococcal Polysaccharide-23 08/21/2016   Td 07/31/2000, 10/05/2010   Zoster, Live 10/05/2010    Health Maintenance  Topic Date Due   Zoster Vaccines- Shingrix (1 of 2) 07/13/2022 (Originally 04/25/2000)   INFLUENZA VACCINE  10/29/2022 (Originally 02/28/2022)   COVID-19 Vaccine (1) 04/14/2023 (Originally 10/24/1951)   TETANUS/TDAP  04/15/2023 (Originally 10/04/2020)   COLONOSCOPY (Pts 45-41yr Insurance coverage will need to be confirmed)  12/07/2022   MAMMOGRAM  05/12/2023   Pneumonia Vaccine 71 Years old  Completed   DEXA SCAN  Completed   Hepatitis C Screening  Completed   HPV VACCINES  Aged Out   Fecal DNA (Cologuard)  Discontinued       Assessment  This is a routine wellness examination for Margaret [name]  Health Maintenance: Due or Overdue There are no preventive care reminders to display for this patient.  Margaret [name] not need a referral for Community Assistance: Care Management:   no Social Work:    no Prescription Assistance:  no Nutrition/Diabetes Education:  no   Plan:  Personalized Goals  Goals Addressed               This Visit's Progress     Patient Stated (pt-stated)        She would like to be able to do gardening again as it helped with her mood a lot.        Personalized Health Maintenance & Screening Recommendations  Influenza vaccine Td vaccine Screening mammography Shingrix vaccine  Patient declined influenza vaccine.   Lung Cancer Screening Recommended: no (Low Dose CT Chest recommended if Age 72-80years, 30 pack-year currently smoking OR have quit w/in past 15 years) Hepatitis C Screening recommended: no HIV Screening recommended: no  Advanced Directives: Written information was not prepared per patient's request.  Referrals & Orders No orders of the defined types were placed in this encounter.   Follow-up Plan Follow-up with MHali Marry MD as planned Schedule  your tetanus and shingles vaccine at the pharmacy.  Mammogram will be due in October.  Medicare wellness visit in one year. AVS printed and mailed.   I have personally reviewed and noted the following in the patient's chart:   Medical and social history Use of alcohol, tobacco or illicit drugs  Current medications and supplements Functional ability and status Nutritional status Physical activity Advanced directives List of other physicians Hospitalizations, surgeries, and ER visits in previous 12 months Vitals Screenings to include cognitive, depression, and falls Referrals and appointments  In addition, I have reviewed and discussed with Margaret [name] preventive protocols, quality metrics, and best practice recommendations. A written personalized care plan for preventive services as well as general preventive health recommendations is available and can be mailed to the patient at her request.      BTinnie Gens RN BSN  04/17/2022

## 2022-04-17 NOTE — Patient Instructions (Addendum)
[facility] Maintenance Summary and Written Plan of Care  [name] ,  Thank you for allowing me to perform your Medicare Annual Wellness Visit and for your ongoing commitment to your health.   Health Maintenance & Immunization History Health Maintenance  Topic Date Due  . Zoster Vaccines- Shingrix (1 of 2) 07/13/2022 (Originally 04/25/2000)  . INFLUENZA VACCINE  10/29/2022 (Originally 02/28/2022)  . COVID-19 Vaccine (1) 04/14/2023 (Originally 10/24/1951)  . TETANUS/TDAP  04/15/2023 (Originally 10/04/2020)  . COLONOSCOPY (Pts 45-34yr Insurance coverage will need to be confirmed)  12/07/2022  . MAMMOGRAM  05/12/2023  . Pneumonia Vaccine 71 Years old  Completed  . DEXA SCAN  Completed  . Hepatitis C Screening  Completed  . HPV VACCINES  Aged Out  . Fecal DNA (Cologuard)  Discontinued   Immunization History  Administered Date(s) Administered  . Influenza Whole 05/11/2008, 04/30/2009, 03/23/2011  . Pneumococcal Conjugate-13 04/26/2015  . Pneumococcal Polysaccharide-23 08/21/2016  . Td 07/31/2000, 10/05/2010  . Zoster, Live 10/05/2010    These are the patient goals that we discussed:  Goals Addressed              This Visit's Progress   .  Patient Stated (pt-stated)        She would like to be able to do gardening again as it helped with her mood a lot.         This is a list of Health Maintenance Items that are overdue or due now: Influenza vaccine Td vaccine Screening mammography Shingrix vaccine  Patient declined influenza vaccine.   Orders/Referrals Placed Today: No orders of the defined types were placed in this encounter.  (Contact our referral department at [phone] you have not spoken with someone about your referral appointment within the next 5 days)    Follow-up Plan Follow-up with MHali Marry MD as planned Schedule your tetanus and shingles vaccine at the pharmacy.  Mammogram will be due in October.   Medicare wellness visit in one year. AVS printed and mailed.      Health Maintenance, Female Adopting a healthy lifestyle and getting preventive care are important in promoting health and wellness. Ask your health care provider about: The right schedule for you to have regular tests and exams. Things you can do on your own to prevent diseases and keep yourself healthy. What should I know about diet, weight, and exercise? Eat a healthy diet  Eat a diet that includes plenty of vegetables, fruits, low-fat dairy products, and lean protein. Do not eat a lot of foods that are high in solid fats, added sugars, or sodium. Maintain a healthy weight Body mass index (BMI) is used to identify weight problems. It estimates body fat based on height and weight. Your health care provider can help determine your BMI and help you achieve or maintain a healthy weight. Get regular exercise Get regular exercise. This is one of the most important things you can do for your health. Most adults should: Exercise for at least 150 minutes each week. The exercise should increase your heart rate and make you sweat (moderate-intensity exercise). Do strengthening exercises at least twice a week. This is in addition to the moderate-intensity exercise. Spend less time sitting. Even light physical activity can be beneficial. Watch cholesterol and blood lipids Have your blood tested for lipids and cholesterol at 72years of age, then have this test every 5 years. Have your cholesterol levels checked more often if: Your lipid or cholesterol  levels are high. You are older than 72 years of age. You are at high risk for heart disease. What should I know about cancer screening? Depending on your health history and family history, you may need to have cancer screening at various ages. This may include screening for: Breast cancer. Cervical cancer. Colorectal cancer. Skin cancer. Lung cancer. What should I know about  heart disease, diabetes, and high blood pressure? Blood pressure and heart disease High blood pressure causes heart disease and increases the risk of stroke. This is more likely to develop in people who have high blood pressure readings or are overweight. Have your blood pressure checked: Every 3-5 years if you are 72-72 years of age. Every year if you are 72 years old or older. Diabetes Have regular diabetes screenings. This checks your fasting blood sugar level. Have the screening done: Once every three years after age 72 if you are at a normal weight and have a low risk for diabetes. More often and at a younger age if you are overweight or have a high risk for diabetes. What should I know about preventing infection? Hepatitis B If you have a higher risk for hepatitis B, you should be screened for this virus. Talk with your health care provider to find out if you are at risk for hepatitis B infection. Hepatitis C Testing is recommended for: Everyone born from [birth year] through 1966. Anyone with known risk factors for hepatitis C. Sexually transmitted infections (STIs) Get screened for STIs, including gonorrhea and chlamydia, if: You are sexually active and are younger than 72 years of age. You are older than 72 years of age and your health care provider tells you that you are at risk for this type of infection. Your sexual activity has changed since you were last screened, and you are at increased risk for chlamydia or gonorrhea. Ask your health care provider if you are at risk. Ask your health care provider about whether you are at high risk for HIV. Your health care provider may recommend a prescription medicine to help prevent HIV infection. If you choose to take medicine to prevent HIV, you should first get tested for HIV. You should then be tested every 3 months for as long as you are taking the medicine. Pregnancy If you are about to stop having your period (premenopausal) and you may  become pregnant, seek counseling before you get pregnant. Take 400 to 800 micrograms (mcg) of folic acid every day if you become pregnant. Ask for birth control (contraception) if you want to prevent pregnancy. Osteoporosis and menopause Osteoporosis is a disease in which the bones lose minerals and strength with aging. This can result in bone fractures. If you are 29 years old or older, or if you are at risk for osteoporosis and fractures, ask your health care provider if you should: Be screened for bone loss. Take a calcium or vitamin D supplement to lower your risk of fractures. Be given hormone replacement therapy (HRT) to treat symptoms of menopause. Follow these instructions at home: Alcohol use Do not drink alcohol if: Your health care provider tells you not to drink. You are pregnant, may be pregnant, or are planning to become pregnant. If you drink alcohol: Limit how much you have to: 0-1 drink a day. Know how much alcohol is in your drink. In the [place] one drink equals one 12 oz bottle of beer (355 mL), one 5 oz glass of wine (148 mL), or one 1 oz glass of  hard liquor (44 mL). Lifestyle Do not use any products that contain nicotine or tobacco. These products include cigarettes, chewing tobacco, and vaping devices, such as e-cigarettes. If you need help quitting, ask your health care provider. Do not use street drugs. Do not share needles. Ask your health care provider for help if you need support or information about quitting drugs. General instructions Schedule regular health, dental, and eye exams. Stay current with your vaccines. Tell your health care provider if: You often feel depressed. You have ever been abused or do not feel safe at home. Summary Adopting a healthy lifestyle and getting preventive care are important in promoting health and wellness. Follow your health care provider's instructions about healthy diet, exercising, and getting tested or screened for  diseases. Follow your health care provider's instructions on monitoring your cholesterol and blood pressure. This information is not intended to replace advice given to you by your health care provider. Make sure you discuss any questions you have with your health care provider. Document Revised: 12/06/2020 Document Reviewed: 12/06/2020 Elsevier Patient Education  [place].

## 2022-10-12 ENCOUNTER — Ambulatory Visit (INDEPENDENT_AMBULATORY_CARE_PROVIDER_SITE_OTHER): Payer: Medicare Other | Admitting: Family Medicine

## 2022-10-12 ENCOUNTER — Encounter: Payer: Self-pay | Admitting: Family Medicine

## 2022-10-12 VITALS — BP 131/74 | HR 76 | Ht 67.0 in | Wt 147.0 lb

## 2022-10-12 DIAGNOSIS — M810 Age-related osteoporosis without current pathological fracture: Secondary | ICD-10-CM

## 2022-10-12 DIAGNOSIS — N1831 Chronic kidney disease, stage 3a: Secondary | ICD-10-CM

## 2022-10-12 DIAGNOSIS — F339 Major depressive disorder, recurrent, unspecified: Secondary | ICD-10-CM

## 2022-10-12 DIAGNOSIS — M150 Primary generalized (osteo)arthritis: Secondary | ICD-10-CM

## 2022-10-12 DIAGNOSIS — E785 Hyperlipidemia, unspecified: Secondary | ICD-10-CM | POA: Diagnosis not present

## 2022-10-12 DIAGNOSIS — I10 Essential (primary) hypertension: Secondary | ICD-10-CM

## 2022-10-12 DIAGNOSIS — M159 Polyosteoarthritis, unspecified: Secondary | ICD-10-CM

## 2022-10-12 DIAGNOSIS — E559 Vitamin D deficiency, unspecified: Secondary | ICD-10-CM | POA: Diagnosis not present

## 2022-10-12 MED ORDER — FLUOXETINE HCL 10 MG PO CAPS: 10.0000 mg | ORAL_CAPSULE | Freq: Every day | ORAL | 3 refills | Status: DC

## 2022-10-12 MED ORDER — SIMVASTATIN 40 MG PO TABS: ORAL_TABLET | ORAL | 3 refills | Status: DC

## 2022-10-12 MED ORDER — HYDROCHLOROTHIAZIDE 12.5 MG PO CAPS: ORAL_CAPSULE | ORAL | 3 refills | Status: DC

## 2022-10-12 MED ORDER — MELOXICAM 15 MG PO TABS: 15.0000 mg | ORAL_TABLET | Freq: Every day | ORAL | 1 refills | Status: DC | PRN

## 2022-10-12 MED ORDER — ALENDRONATE SODIUM 70 MG PO TABS: ORAL_TABLET | ORAL | 4 refills | Status: DC

## 2022-10-12 NOTE — Assessment & Plan Note (Signed)
Continue current regimen.  Follow-up in 6 months.  Refills sent to pharmacy.  She feels happy with her current dose.

## 2022-10-12 NOTE — Assessment & Plan Note (Signed)
BP well controlled.  Due for lab.

## 2022-10-12 NOTE — Patient Instructions (Signed)
Please check with your insurance to see if they will cover the Shingles and Tdap vaccines at our office.

## 2022-10-12 NOTE — Assessment & Plan Note (Signed)
Check levels to make sure that they are adequate.  Last time we checked labs they were still low but were improving.

## 2022-10-12 NOTE — Assessment & Plan Note (Signed)
Get updated labs including urine microalbumin.

## 2022-10-12 NOTE — Progress Notes (Signed)
Established Patient Office Visit  Subjective   Patient ID: Margaret [name], female    DOB: 03/21/1951  Age: 72 y.o. MRN: [id number]  Chief Complaint  Patient presents with   Hypertension    HPI  Hypertension- Pt denies chest pain, SOB, dizziness, or heart palpitations.  Taking meds as directed w/o problems.  Denies medication side effects.    She has been under some stress recently her daughter was diagnosed with stage IV kidney disease.  She had times in her life where she had uncontrolled hypertension that they think may have led to this.  He declines the flu vaccine today.  Follow-up depression-overall she feels like she is doing really well.  PHQ-9 score of 0 she has been happy with the fluoxetine and feels like it has been helpful for her stress.  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  Constitutional:      Appearance: She is well-developed.  HENT:     Head: Normocephalic and atraumatic.  Cardiovascular:     Rate and Rhythm: Normal rate and regular rhythm.     Heart sounds: Normal heart sounds.  Pulmonary:     Effort: Pulmonary effort is normal.     Breath sounds: Normal breath sounds.  Skin:    General: Skin is warm and dry.  Neurological:     Mental Status: She is alert and oriented to person, place, and time.  Psychiatric:        Behavior: Behavior normal.      No results found for any visits on 10/12/22.    The 10-year ASCVD risk score (Arnett DK, et al., 2019) is: 15.6%    Assessment & Plan:   Problem List Items Addressed This Visit       Cardiovascular and Mediastinum   Essential hypertension - Primary    BP well controlled.  Due for lab.        Relevant Medications   hydrochlorothiazide (MICROZIDE) 12.5 MG capsule   simvastatin (ZOCOR) 40 MG  tablet   Other Relevant Orders   Lipid Panel w/reflex Direct LDL   COMPLETE METABOLIC PANEL WITH GFR   CBC     Musculoskeletal and Integument   Osteoporosis    Sinew Fosamax, calcium and vitamin D.  Recent DEXA scan up-to-date not due for repeat yet.      Relevant Medications   alendronate (FOSAMAX) 70 MG tablet   Other Relevant Orders   VITAMIN D 25 Hydroxy (Vit-D Deficiency, Fractures)   Osteoarthritis   Relevant Medications   meloxicam (MOBIC) 15 MG tablet     Genitourinary   CKD (chronic kidney disease) stage 3, GFR 30-59 ml/min (HCC)    Get updated labs including urine microalbumin.      Relevant Orders   Lipid Panel w/reflex Direct LDL   COMPLETE METABOLIC PANEL WITH GFR   CBC   VITAMIN D 25 Hydroxy (Vit-D Deficiency, Fractures)   Urine Microalbumin w/creat. ratio   Parathyroid hormone, intact (no Ca)   Phosphorus     Other   Vitamin D deficiency    Check levels to make sure that they are adequate.  Last time we checked labs they were still low  but were improving.      Relevant Orders   VITAMIN D 25 Hydroxy (Vit-D Deficiency, Fractures)   Hyperlipidemia   Relevant Medications   hydrochlorothiazide (MICROZIDE) 12.5 MG capsule   simvastatin (ZOCOR) 40 MG tablet   Other Relevant Orders   Lipid Panel w/reflex Direct LDL   COMPLETE METABOLIC PANEL WITH GFR   CBC   Depression, recurrent (HCC)    Continue current regimen.  Follow-up in 6 months.  Refills sent to pharmacy.  She feels happy with her current dose.      Relevant Medications   FLUoxetine (PROZAC) 10 MG capsule   Discussed Tdap and Shingles vaccines. She doesn't have Medicare Part D.    Return in about 6 months (around 04/14/2023) for Hypertension and Mood.    Beatrice Lecher, MD

## 2022-10-12 NOTE — Assessment & Plan Note (Signed)
Sinew Fosamax, calcium and vitamin D.  Recent DEXA scan up-to-date not due for repeat yet.

## 2022-10-13 LAB — CBC
HCT: 42.3 % (ref 35.0–45.0)
Hemoglobin: 14.3 g/dL (ref 11.7–15.5)
MCH: 30.2 pg (ref 27.0–33.0)
MCHC: 33.8 g/dL (ref 32.0–36.0)
MCV: 89.2 fL (ref 80.0–100.0)
MPV: 11.3 fL (ref 7.5–12.5)
Platelets: 278 10*3/uL (ref 140–400)
RBC: 4.74 10*6/uL (ref 3.80–5.10)
RDW: 12 % (ref 11.0–15.0)
WBC: 5.8 10*3/uL (ref 3.8–10.8)

## 2022-10-13 LAB — COMPLETE METABOLIC PANEL WITH GFR
AG Ratio: 1.4 (calc) (ref 1.0–2.5)
ALT: 11 U/L (ref 6–29)
AST: 13 U/L (ref 10–35)
Albumin: 4.4 g/dL (ref 3.6–5.1)
Alkaline phosphatase (APISO): 58 U/L (ref 37–153)
BUN: 14 mg/dL (ref 7–25)
CO2: 29 mmol/L (ref 20–32)
Calcium: 9.3 mg/dL (ref 8.6–10.4)
Chloride: 107 mmol/L (ref 98–110)
Creat: 0.98 mg/dL (ref 0.60–1.00)
Globulin: 3.1 g/dL (calc) (ref 1.9–3.7)
Glucose, Bld: 94 mg/dL (ref 65–99)
Potassium: 4.2 mmol/L (ref 3.5–5.3)
Sodium: 144 mmol/L (ref 135–146)
Total Bilirubin: 0.9 mg/dL (ref 0.2–1.2)
Total Protein: 7.5 g/dL (ref 6.1–8.1)
eGFR: 61 mL/min/{1.73_m2} (ref >=60)

## 2022-10-13 LAB — LIPID PANEL W/REFLEX DIRECT LDL
Cholesterol: 151 mg/dL (ref <200)
HDL: 51 mg/dL (ref >=50)
LDL Cholesterol (Calc): 81 mg/dL (calc)
Non-HDL Cholesterol (Calc): 100 mg/dL (calc) (ref <130)
Total CHOL/HDL Ratio: 3 (calc) (ref <5.0)
Triglycerides: 99 mg/dL (ref <150)

## 2022-10-13 LAB — MICROALBUMIN / CREATININE URINE RATIO
Creatinine, Urine: 135 mg/dL (ref 20–275)
Microalb Creat Ratio: 13 mcg/mg creat (ref <30)
Microalb, Ur: 1.8 mg/dL

## 2022-10-13 LAB — PHOSPHORUS: Phosphorus: 3.7 mg/dL (ref 2.1–4.3)

## 2022-10-13 LAB — PARATHYROID HORMONE, INTACT (NO CA): PTH: 80 pg/mL — ABNORMAL HIGH (ref 16–77)

## 2022-10-13 LAB — VITAMIN D 25 HYDROXY (VIT D DEFICIENCY, FRACTURES): Vit D, 25-Hydroxy: 19 ng/mL — ABNORMAL LOW (ref 30–100)

## 2022-10-13 NOTE — Progress Notes (Signed)
Margaret [name], vitamin D has dropped a little lower than it was last year.  Are you still taking any vitamin D?  I would recommend 25 to 50 mcg daily.  Your blood count and cholesterol look great.  Metabolic panel is normal.  No excess protein in the urine which is good.  Phosphorus is normal.  PTH still pending.

## 2022-10-16 NOTE — Progress Notes (Signed)
In addition to the note below. The PTH lvel is just slightly elevated.  I would like to recheck that in 8 weeks after getting on vitamin D to see if corrects. We can recheck Vit D at that time as well.

## 2022-10-23 ENCOUNTER — Telehealth: Payer: Self-pay | Admitting: *Deleted

## 2022-10-23 NOTE — Telephone Encounter (Signed)
I would recommend adding 25 mcg daily to what is already included in her calcium supplement.

## 2022-10-23 NOTE — Telephone Encounter (Signed)
Pt called and lvm  stating that she does take calcium 1200 mg with Vit D 3. She wanted to know if that is enough? Or should she be taking more D in addition to what she is taking?

## 2022-10-24 NOTE — Telephone Encounter (Signed)
I tried to call patient multiple times, phone rings busy.

## 2022-10-26 NOTE — Telephone Encounter (Signed)
Attempted to call pt several times phone line was busy every time

## 2022-10-31 NOTE — Telephone Encounter (Signed)
Attempted to contact the patient regarding the provider's recommendation. The phone line is "busy". No option to leave a vm msg.

## 2022-11-01 NOTE — Telephone Encounter (Signed)
Called pt got busy signal

## 2022-11-07 NOTE — Telephone Encounter (Signed)
Closing encounter due to not being able to get in touch with patient

## 2022-11-08 ENCOUNTER — Encounter: Payer: Self-pay | Admitting: Gastroenterology

## 2023-04-12 ENCOUNTER — Ambulatory Visit (INDEPENDENT_AMBULATORY_CARE_PROVIDER_SITE_OTHER): Payer: Medicare Other | Admitting: Family Medicine

## 2023-04-12 ENCOUNTER — Encounter: Payer: Self-pay | Admitting: Family Medicine

## 2023-04-12 ENCOUNTER — Telehealth: Payer: Self-pay | Admitting: Family Medicine

## 2023-04-12 VITALS — BP 136/76 | HR 91 | Ht 67.0 in | Wt 161.0 lb

## 2023-04-12 DIAGNOSIS — N1831 Chronic kidney disease, stage 3a: Secondary | ICD-10-CM | POA: Diagnosis not present

## 2023-04-12 DIAGNOSIS — E559 Vitamin D deficiency, unspecified: Secondary | ICD-10-CM

## 2023-04-12 DIAGNOSIS — M810 Age-related osteoporosis without current pathological fracture: Secondary | ICD-10-CM

## 2023-04-12 DIAGNOSIS — I10 Essential (primary) hypertension: Secondary | ICD-10-CM | POA: Diagnosis not present

## 2023-04-12 NOTE — Assessment & Plan Note (Signed)
Her daughter actually has CKD 4 so it does make me wonder about potential genetic component.  Will continue to monitor renal function every 6 months and we will also get a urine microalbumin.

## 2023-04-12 NOTE — Assessment & Plan Note (Signed)
Controlled.  Continue current regimen.  Due for updated labs.  Follow-up in 6 months.

## 2023-04-12 NOTE — Progress Notes (Signed)
   Established Patient Office Visit  Subjective   Patient ID: Margaret [name], female    DOB: 19-Jul-1951  Age: 72 y.o. MRN: [id number]  Chief Complaint  Patient presents with   Hypertension    HPI  Hypertension- Pt denies chest pain, SOB, dizziness, or heart palpitations.  Taking meds as directed w/o problems.  Denies medication side effects.    Says she is doing well with her mood. No concerns. Her daughter and her grandson live with her now. Her Lucila [place] is a bright spot in her life.   She says she is staying active with getting outdoors with her grandson and she feels like its actually been really good for her.  Due for repeat DEXA next month it will be 2 years she does still take her calcium with vitamin D.    ROS    Objective:     BP 136/76   Pulse 91   Ht 5\' 7"  (1.702 m)   Wt 161 lb (73 kg)   SpO2 99%   BMI 25.22 kg/m    Physical Exam Vitals and nursing note reviewed.  Constitutional:      Appearance: Normal appearance.  HENT:     Head: Normocephalic and atraumatic.  Eyes:     Conjunctiva/sclera: Conjunctivae normal.  Cardiovascular:     Rate and Rhythm: Normal rate and regular rhythm.  Pulmonary:     Effort: Pulmonary effort is normal.     Breath sounds: Normal breath sounds.  Skin:    General: Skin is warm and dry.  Neurological:     Mental Status: She is alert.  Psychiatric:        Mood and Affect: Mood normal.     No results found for any visits on 04/12/23.    The 10-year ASCVD risk score (Arnett DK, et al., 2019) is: 16.5%    Assessment & Plan:   Problem List Items Addressed This Visit       Cardiovascular and Mediastinum   Essential hypertension - Primary    Controlled.  Continue current regimen.  Due for updated labs.  Follow-up in 6 months.      Relevant Orders   BMP8+EGFR     Musculoskeletal and Integument   Osteoporosis   Relevant Orders   VITAMIN D 25 Hydroxy (Vit-D Deficiency, Fractures)   DG Bone Density      Genitourinary   CKD (chronic kidney disease) stage 3, GFR 30-59 ml/min (HCC)    Her daughter actually has CKD 4 so it does make me wonder about potential genetic component.  Will continue to monitor renal function every 6 months and we will also get a urine microalbumin.      Relevant Orders   BMP8+EGFR   Urine Microalbumin w/creat. ratio     Other   Vitamin D deficiency    Plan to check vitamin D level.      Will call her and remind her that she is due for repeat colonoscopy.  Return in about 6 months (around 10/10/2023) for Hypertension.    Nani Gasser, MD

## 2023-04-12 NOTE — Assessment & Plan Note (Signed)
Plan to check vitamin D level. 

## 2023-04-12 NOTE — Telephone Encounter (Signed)
Call patient and let her know that she is due for repeat colonoscopy admit to remind her while she was here.  Unless she has already had that done or updated already has an appointment I am happy to place a referral if she needs.

## 2023-04-13 LAB — BMP8+EGFR
BUN/Creatinine Ratio: 18 (ref 12–28)
BUN: 18 mg/dL (ref 8–27)
CO2: 22 mmol/L (ref 20–29)
Calcium: 9.1 mg/dL (ref 8.7–10.3)
Chloride: 103 mmol/L (ref 96–106)
Creatinine, Ser: 1.01 mg/dL — ABNORMAL HIGH (ref 0.57–1.00)
Glucose: 64 mg/dL — ABNORMAL LOW (ref 70–99)
Potassium: 4 mmol/L (ref 3.5–5.2)
Sodium: 141 mmol/L (ref 134–144)
eGFR: 59 mL/min/{1.73_m2} — ABNORMAL LOW (ref >59)

## 2023-04-13 LAB — MICROALBUMIN / CREATININE URINE RATIO
Creatinine, Urine: 96.6 mg/dL
Microalb/Creat Ratio: 461 mg/g{creat} — ABNORMAL HIGH (ref 0–29)
Microalbumin, Urine: 445.8 ug/mL

## 2023-04-13 LAB — VITAMIN D 25 HYDROXY (VIT D DEFICIENCY, FRACTURES): Vit D, 25-Hydroxy: 17.3 ng/mL — ABNORMAL LOW (ref 30.0–100.0)

## 2023-04-13 NOTE — Progress Notes (Signed)
Hi Margaret [name], kidney function is stable at 1.0.  Your vitamin D is still low are you taking a supplement?  If not then I would recommend starting with 25 mcg daily.  If you are already taking something then please let me know and maybe we can adjust your dose.  Please let us know what you would like to do for colon cancer screening.  I also think we may not have your most up-to-date phone number we actually try to call you back after your appointment yesterday and had difficulty getting in touch.

## 2023-04-13 NOTE — Telephone Encounter (Signed)
Attempted call- fast busy signal could not leave a voice mail message.

## 2023-04-16 NOTE — Progress Notes (Signed)
Margaret [name], we also received the protein test on the urine.  It is showing a lot more protein normal your test is normal.  I would like to repeat this in 1 or 2 weeks.

## 2023-04-20 NOTE — Telephone Encounter (Signed)
Spoke with pharmacy and was given # [phone]. Called and left message requesting  a return call on this number.

## 2023-04-23 ENCOUNTER — Telehealth: Payer: Self-pay | Admitting: General Practice

## 2023-04-23 NOTE — Telephone Encounter (Signed)
Patient was scheduled for medicare wellness visit today at 3 pm. Tried to call patient x 3 at the phone number listed in the chart. Unable to get in touch with the patient or leave a voicemail. There was a busy signal all three times when attempted to call patient. Appt cancelled to avoid no show fee.

## 2023-04-25 NOTE — Telephone Encounter (Signed)
Have not been able to reach patient  by any numbers listed in chart.

## 2023-04-27 NOTE — Telephone Encounter (Signed)
Letter placed in mail to patient.  

## 2023-07-06 ENCOUNTER — Telehealth: Payer: Self-pay

## 2023-07-06 ENCOUNTER — Other Ambulatory Visit: Payer: Self-pay

## 2023-07-06 ENCOUNTER — Ambulatory Visit
Admission: EM | Admit: 2023-07-06 | Discharge: 2023-07-06 | Disposition: A | Discharge status: Home / Self Care | Payer: Medicare Other | Attending: Family Medicine | Admitting: Family Medicine

## 2023-07-06 ENCOUNTER — Ambulatory Visit: Payer: Medicare Other

## 2023-07-06 DIAGNOSIS — S89301A Unspecified physeal fracture of lower end of right fibula, initial encounter for closed fracture: Secondary | ICD-10-CM | POA: Diagnosis not present

## 2023-07-06 DIAGNOSIS — S82421A Displaced transverse fracture of shaft of right fibula, initial encounter for closed fracture: Secondary | ICD-10-CM | POA: Diagnosis not present

## 2023-07-06 MED ORDER — ONDANSETRON HCL 4 MG PO TABS: 4.0000 mg | ORAL_TABLET | Freq: Three times a day (TID) | ORAL | 0 refills | Status: DC | PRN

## 2023-07-06 MED ORDER — ACETAMINOPHEN-CODEINE 300-30 MG PO TABS
1.0000 | ORAL_TABLET | Freq: Two times a day (BID) | ORAL | 0 refills | Status: AC | PRN
Start: 2023-07-06 — End: 2023-07-11

## 2023-07-06 NOTE — ED Provider Notes (Signed)
[name]    CSN: [id number] Arrival date & time: 07/06/23  1005      History   Chief Complaint Chief Complaint  Patient presents with   Ankle Pain    HPI Margaret [name] is a 72 y.o. female.    Ankle Pain Patient presents today for evaluation of an injury involving the right ankle.  Patient reports while attempting to put on boots earlier this morning she rolled her right ankle and heard a pop and felt immediate pain.  She is concerned that she fractured her ankle.  Per review of problem list active patient has been diagnosed with osteoporosis per bone density testing. 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      Home Medications    Prior to Admission medications   Medication Sig Start Date End Date Taking? Authorizing Provider  alendronate (FOSAMAX) 70 MG tablet TAKE 1 TABLET WEEKLY TAKE ON EMPTY STOMACH WITH 8 OZ OF WATER(DO NOT  LIE DOWN FOR 30 MINUTES) 10/12/22   Agapito Games, MD  Ascorbic Acid (VITAMIN C) 500 MG CAPS Take 2 capsules by mouth daily.    [provider]  Calcium Carbonate-Vit D-Min (CALCIUM 1200 PO) Take 2 capsules by mouth daily.    [provider]  Cetirizine HCl 10 MG CAPS Take 1 capsule by mouth daily.    [provider]  FLUoxetine (PROZAC) 10 MG capsule Take 1 capsule (10 mg total) by mouth daily. 10/12/22   Agapito Games, MD  hydrochlorothiazide (MICROZIDE) 12.5 MG capsule TAKE 1 CAPSULE(12.5 MG) BY MOUTH DAILY 10/12/22   Agapito Games, MD  Multiple Vitamins-Minerals (CENTRUM SILVER ADULT 50+ PO) Take 1 capsule by mouth daily.    [provider]  ranitidine (ZANTAC) 150 MG tablet Take 150 mg by mouth daily.    [provider]  simvastatin (ZOCOR) 40 MG tablet TAKE 1 TABLET(40 MG) BY MOUTH AT BEDTIME 10/12/22   Agapito Games, MD    Family History Family History  Problem Relation Age of Onset   Stomach cancer Father 36       deceased   Diabetes Father    Cancer Father        stomach   Bell's palsy Mother    Parkinsonism Mother        died age 17  Dementia Mother    Asthma Mother    Asthma Other    Depression Other    Bipolar disorder Other    Diabetes Sister    Diabetes Brother    Colon polyps Brother    Colon cancer Neg Hx    Esophageal cancer Neg Hx     Social History Social History   Tobacco Use   Smoking status: Former    Types: Cigarettes   Smokeless tobacco: Never  Vaping Use   Vaping status: Never Used  Substance Use Topics   Alcohol use: No   Drug use: No     Allergies   Diphenhydramine hcl, Codeine, and Fish oil   Review of Systems Review of Systems Pertinent negatives listed in HPI  Physical Exam Triage Vital Signs ED Triage Vitals 07/06/23 1037  Encounter Vitals Group     BP (!) 144/99     Systolic BP Percentile      Diastolic BP Percentile      Pulse Rate 93     Resp 16      Temp 97.6 F (36.4 C)     Temp Source Oral     SpO2 98 %     Weight      Height      Head Circumference      Peak Flow      Pain Score 7     Pain Loc      Pain Education      Exclude from Growth Chart    No data found.  Updated Vital Signs BP (!) 144/99   Pulse 93   Temp 97.6 F (36.4 C) (Oral)   Resp 16   SpO2 98%   Visual Acuity Right Eye Distance:   Left Eye Distance:   Bilateral Distance:    Right Eye Near:   Left Eye Near:    Bilateral Near:     Physical Exam Constitutional:      Appearance: Normal appearance.  Eyes:     Extraocular Movements: Extraocular movements intact.     Conjunctiva/sclera: Conjunctivae normal.     Pupils: Pupils are equal, round, and reactive to light.  Cardiovascular:     Rate and Rhythm: Normal rate and regular rhythm.  Pulmonary:     Effort: Pulmonary effort is normal.     Breath sounds: Normal breath sounds.  Musculoskeletal:     Right ankle: Swelling present. Tenderness present over the lateral malleolus and proximal fibula. Decreased range of motion.     Left ankle: Normal.     Left Achilles Tendon: Normal.     Right foot: Swelling present.  Skin:    General: Skin is warm.  Neurological:     General: No focal deficit present.     Mental Status: She is alert.    UC Treatments / Results  Labs (all labs ordered are listed, but only abnormal results are displayed) Labs Reviewed - No data to display  EKG   Radiology DG Ankle Complete Right  Result Date: 07/06/2023 CLINICAL DATA:  Ankle injury with pain. EXAM: RIGHT ANKLE - COMPLETE 3+ VIEW COMPARISON:  None Available. FINDINGS: Minimally displaced transverse fracture noted at the tip of the distal fibula. No evidence for distal tibia fracture. No subluxation or dislocation. Soft tissue swelling evident. IMPRESSION: Minimally displaced transverse fracture at the tip of the distal fibula. Electronically Signed   By: Kennith Center M.D.   On: 07/06/2023 11:20     Procedures Procedures (including critical care time)  Medications Ordered in UC Medications - No data to display  Initial Impression / Assessment and Plan / UC Course  I have reviewed the triage vital signs and the nursing notes.  Pertinent labs & imaging results that were available during my care of the patient were reviewed by me and considered in my medical decision making (see chart for details).    Consulted with Dr. Benjamin Stain who reviewed impression of right ankle and reports the patient can initially follow-up with primary care provider and will do a subsequent follow-up with him.  Patient is being placed in a postop cam boot along with a walker as she will need to remain nonweightbearing until cleared by orthopedic/sports medicine provider.  Prescribed Tylenol 3 for moderate to severe pain.  Patient reports pain is currently controlled with plain Tylenol.  Patient has a nausea and vomiting with codeine therefore prescribe Zofran 4 mg every 8 hours as needed in the event that nausea symptoms develop.  Return precautions given if at any point symptoms worsen prior to follow-up with primary care provider. Final Clinical Impressions(s) / UC Diagnoses   Final diagnoses:  Displaced physeal fracture of distal end of right fibula, initial encounter   Discharge Instructions   None    ED Prescriptions   None    I have reviewed the PDMP during this encounter.   Bing Neighbors, NP 07/06/23 [phone]

## 2023-07-06 NOTE — Discharge Instructions (Signed)
He will follow-up with Dr. Linford Arnold for your initial follow-up and she will likely have you follow-up with Dr. Karie Schwalbe within the next few weeks.  As stated remain nonweightbearing to prevent reinjury or delay healing of fracture. As discussed purchase of a bath stool to place in your shower for you to sit and bathe.  I have prescribed you Tylenol 3 along with Zofran as your chart indicates she previously had nausea and vomiting with codeine.  If Tylenol is taking care of your pain you can continue plain Tylenol only take Tylenol with codeine if you are in moderate to severe pain.

## 2023-07-06 NOTE — ED Triage Notes (Signed)
Pt presents to uc with co of right ankle pain since this morning. Pt repots she was attempting to put on her winter boots this morning about 3 am when she hear a poping noise.  She has  iced it and taken some motrin.

## 2023-07-11 ENCOUNTER — Inpatient Hospital Stay: Payer: Medicare Other | Admitting: Family Medicine

## 2023-07-17 ENCOUNTER — Encounter: Payer: Self-pay | Admitting: Family Medicine

## 2023-07-17 ENCOUNTER — Ambulatory Visit (INDEPENDENT_AMBULATORY_CARE_PROVIDER_SITE_OTHER): Payer: Medicare Other | Admitting: Family Medicine

## 2023-07-17 ENCOUNTER — Ambulatory Visit: Payer: Medicare Other

## 2023-07-17 VITALS — BP 131/86 | HR 88 | Ht 67.0 in | Wt 161.0 lb

## 2023-07-17 DIAGNOSIS — S82831A Other fracture of upper and lower end of right fibula, initial encounter for closed fracture: Secondary | ICD-10-CM | POA: Diagnosis not present

## 2023-07-17 DIAGNOSIS — R300 Dysuria: Secondary | ICD-10-CM

## 2023-07-17 DIAGNOSIS — N3000 Acute cystitis without hematuria: Secondary | ICD-10-CM

## 2023-07-17 DIAGNOSIS — M2011 Hallux valgus (acquired), right foot: Secondary | ICD-10-CM | POA: Diagnosis not present

## 2023-07-17 DIAGNOSIS — M79671 Pain in right foot: Secondary | ICD-10-CM | POA: Diagnosis not present

## 2023-07-17 DIAGNOSIS — M25571 Pain in right ankle and joints of right foot: Secondary | ICD-10-CM | POA: Diagnosis not present

## 2023-07-17 DIAGNOSIS — S9031XA Contusion of right foot, initial encounter: Secondary | ICD-10-CM | POA: Diagnosis not present

## 2023-07-17 LAB — POCT URINALYSIS DIP (CLINITEK)
Bilirubin, UA: NEGATIVE
Glucose, UA: NEGATIVE mg/dL
Ketones, POC UA: NEGATIVE mg/dL
Nitrite, UA: POSITIVE — AB
POC PROTEIN,UA: 30 — AB
Spec Grav, UA: >=1.03 — AB (ref 1.010–1.025)
Urobilinogen, UA: 0.2 U/dL
pH, UA: 5.5 (ref 5.0–8.0)

## 2023-07-17 MED ORDER — NITROFURANTOIN MONOHYD MACRO 100 MG PO CAPS: 100.0000 mg | ORAL_CAPSULE | Freq: Two times a day (BID) | ORAL | 0 refills | Status: DC

## 2023-07-17 NOTE — Progress Notes (Signed)
Established Patient Office Visit  Subjective  Patient ID: Margaret [name], female    DOB: [birth year]  Age: 72 y.o. MRN: [id number]  Chief Complaint  Patient presents with   Hospitalization Follow-up    HPI  She is here today for recent emergency department follow-up she was seen on December 6 after an injury to her right ankle.  She rolled her right ankle and felt a pop and had immediate pain.  She was placed in a postop cam boot along with a walker and needs to remain nonweightbearing until cleared.  She was given Tylenol 3 for moderate to severe pain.  Diagnosed with a displaced physeal fracture of the distal end of the right fibula.  She says overall her pain has been well-controlled with just some ibuprofen occasionally.  She does feel like it is getting better she has been wearing her boot consistently and trying not to bear weight on that foot.  Also reports that she thinks she might have an UTI    ROS    Objective:     BP 131/86   Pulse 88   Ht 5\' 7"  (1.702 m)   Wt 161 lb (73 kg)   SpO2 97%   BMI 25.22 kg/m    Physical Exam Vitals reviewed.  Constitutional:      Appearance: Normal appearance.  HENT:     Head: Normocephalic.  Pulmonary:     Effort: Pulmonary effort is normal.  Musculoskeletal:     Comments: Normal range of motion of the right ankle she has significant swelling and bruising over the lateral ankle and over the lateral foot.  She is tender over the proximal third metatarsal head and she is tender over the lateral malleolus.  Dorsal pedal pulse 1+.  Neurological:     Mental Status: She is alert and oriented to person, place, and time.  Psychiatric:        Mood and Affect: Mood normal.        Behavior: Behavior normal.      Results for orders placed or performed in visit on 07/17/23  POCT URINALYSIS DIP (CLINITEK)  Result Value Ref Range   Color, UA orange (A) yellow   Clarity, UA cloudy (A) clear   Glucose, UA negative negative mg/dL    Bilirubin, UA negative negative   Ketones, POC UA negative negative mg/dL   Spec Grav, UA >=2.956 (A) 1.010 - 1.025   Blood, UA trace-intact (A) negative   pH, UA 5.5 5.0 - 8.0   POC PROTEIN,UA =30 (A) negative, trace   Urobilinogen, UA 0.2 0.2 or 1.0 E.U./dL   Nitrite, UA Positive (A) Negative   Leukocytes, UA Small (1+) (A) Negative      The 10-year ASCVD risk score (Arnett DK, et al., 2019) is: 17.2%    Assessment & Plan:   Problem List Items Addressed This Visit   None Visit Diagnoses       Dysuria    -  Primary   Relevant Orders   POCT URINALYSIS DIP (CLINITEK) (Completed)     Other closed fracture of distal end of right fibula, initial encounter       Relevant Orders   DG Ankle Complete Right   DG Foot Complete Right     Right foot pain       Relevant Orders   DG Ankle Complete Right   DG Foot Complete Right     Acute cystitis without hematuria       Relevant  Medications   nitrofurantoin, macrocrystal-monohydrate, (MACROBID) 100 MG capsule       Will get repeat x-ray just to make sure that the fracture is still in alignment and hopefully were starting to see some calcification occurring.  Continue with tall boot and will have her follow-up with Dr. Benjamin Stain, sports medicine for further treatment.  Continue to avoid bearing weight.  Acute cystitis-will treat for UTI with Macrobid.  Urinalysis positive for blood and nitrites.  Sure drinking plenty of water.  No follow-ups on file.    Nani Gasser, MD

## 2023-07-18 NOTE — Telephone Encounter (Signed)
Spoke with the pt to let her know radiology has to read them first and that they are behind due to short staff. She voiced her understanding. Roselyn Reef, CMA

## 2023-07-30 ENCOUNTER — Telehealth: Payer: Self-pay

## 2023-07-30 NOTE — Telephone Encounter (Signed)
Attempted to contact patient as requested. No answer, phone keeps ringing. Unable to leave a vm msg.

## 2023-07-30 NOTE — Telephone Encounter (Signed)
Patient returned a call back to the clinic. Patient informed of imaging results and scheduled with Dr. Karie Schwalbe for further evaluation on her ankle.

## 2023-07-30 NOTE — Telephone Encounter (Signed)
Copied from CRM [id number]. Topic: Clinical - Lab/Test Results >> Jul 30, 2023 11:15 AM Nila Nephew wrote: Reason for CRM: Patient calling in to state that she has not heard anything regarding results. Provided results verbatim of imaging done on 12/17 to patient. Patient seeking clarification on follow-up instruction, as she never saw Dr. Karie Schwalbe for this issue. Please advise at number on file [phone].

## 2023-07-30 NOTE — Progress Notes (Signed)
Hi Ursala, we finally got the plain film images back and they did not see any fracture in the foot itself just a little bit of her arthritis.  That is very good news.

## 2023-07-30 NOTE — Progress Notes (Signed)
Ankle fracture is still aligned which is great.  Make sure to keep follow-up appoint with Dr. Benjamin Stain.  Not sure if already scheduled or not but please schedule if you have not.

## 2023-08-03 ENCOUNTER — Ambulatory Visit (INDEPENDENT_AMBULATORY_CARE_PROVIDER_SITE_OTHER): Payer: Medicare Other | Admitting: Sports Medicine

## 2023-08-03 DIAGNOSIS — S82831S Other fracture of upper and lower end of right fibula, sequela: Secondary | ICD-10-CM | POA: Diagnosis not present

## 2023-08-03 NOTE — Assessment & Plan Note (Signed)
 4 weeks status post fibular avulsion, pain-free, good motion, good strength. Transition into a lace up brace, formal PT, weightbearing as tolerated in a regular shoe, return to see me in 4 weeks.

## 2023-08-03 NOTE — Progress Notes (Signed)
    Procedures performed today:    None.  Independent interpretation of notes and tests performed by another provider:   None.  Brief History, Exam, Impression, and Recommendations:    Closed avulsion fracture of distal fibula, right, sequela 4 weeks status post fibular avulsion, pain-free, good motion, good strength. Transition into a lace up brace, formal PT, weightbearing as tolerated in a regular shoe, return to see me in 4 weeks.    ____________________________________________ Debby PARAS. Curtis, M.D., ABFM., CAQSM., AME. Primary Care and Sports Medicine [facility]  Adjunct Professor of [facility] Medicine  [employer]  [employer], [employer]

## 2023-08-15 ENCOUNTER — Ambulatory Visit: Payer: Medicare Other | Admitting: Physical Therapy

## 2023-08-28 ENCOUNTER — Telehealth: Payer: Self-pay

## 2023-08-31 ENCOUNTER — Ambulatory Visit: Payer: Medicare Other | Admitting: Sports Medicine

## 2023-10-11 ENCOUNTER — Ambulatory Visit: Payer: Medicare Other | Admitting: Family Medicine

## 2023-11-15 ENCOUNTER — Other Ambulatory Visit: Payer: Self-pay | Admitting: *Deleted

## 2023-11-15 ENCOUNTER — Ambulatory Visit: Admitting: Family Medicine

## 2023-11-15 DIAGNOSIS — Z1211 Encounter for screening for malignant neoplasm of colon: Secondary | ICD-10-CM

## 2023-11-15 DIAGNOSIS — E559 Vitamin D deficiency, unspecified: Secondary | ICD-10-CM

## 2023-11-15 DIAGNOSIS — N1831 Chronic kidney disease, stage 3a: Secondary | ICD-10-CM | POA: Diagnosis not present

## 2023-11-15 DIAGNOSIS — Z1231 Encounter for screening mammogram for malignant neoplasm of breast: Secondary | ICD-10-CM

## 2023-11-15 DIAGNOSIS — I10 Essential (primary) hypertension: Secondary | ICD-10-CM

## 2023-11-15 DIAGNOSIS — E785 Hyperlipidemia, unspecified: Secondary | ICD-10-CM

## 2023-11-16 LAB — LIPID PANEL WITH LDL/HDL RATIO
Cholesterol, Total: 218 mg/dL — ABNORMAL HIGH (ref 100–199)
HDL: 52 mg/dL (ref >39)
LDL Chol Calc (NIH): 137 mg/dL — ABNORMAL HIGH (ref 0–99)
LDL/HDL Ratio: 2.6 ratio (ref 0.0–3.2)
Triglycerides: 162 mg/dL — ABNORMAL HIGH (ref 0–149)
VLDL Cholesterol Cal: 29 mg/dL (ref 5–40)

## 2023-11-16 LAB — MICROALBUMIN / CREATININE URINE RATIO
Creatinine, Urine: 110.3 mg/dL
Microalb/Creat Ratio: 18 mg/g{creat} (ref 0–29)
Microalbumin, Urine: 20.1 ug/mL

## 2023-11-16 LAB — CMP14+EGFR
ALT: 16 IU/L (ref 0–32)
AST: 15 IU/L (ref 0–40)
Albumin: 4.6 g/dL (ref 3.8–4.8)
Alkaline Phosphatase: 74 IU/L (ref 44–121)
BUN/Creatinine Ratio: 15 (ref 12–28)
BUN: 15 mg/dL (ref 8–27)
Bilirubin Total: 0.6 mg/dL (ref 0.0–1.2)
CO2: 23 mmol/L (ref 20–29)
Calcium: 9.9 mg/dL (ref 8.7–10.3)
Chloride: 100 mmol/L (ref 96–106)
Creatinine, Ser: 0.99 mg/dL (ref 0.57–1.00)
Globulin, Total: 2.6 g/dL (ref 1.5–4.5)
Glucose: 88 mg/dL (ref 70–99)
Potassium: 4.6 mmol/L (ref 3.5–5.2)
Sodium: 140 mmol/L (ref 134–144)
Total Protein: 7.2 g/dL (ref 6.0–8.5)
eGFR: 60 mL/min/{1.73_m2} (ref >59)

## 2023-11-16 LAB — VITAMIN D 25 HYDROXY (VIT D DEFICIENCY, FRACTURES): Vit D, 25-Hydroxy: 26.1 ng/mL — ABNORMAL LOW (ref 30.0–100.0)

## 2023-11-19 ENCOUNTER — Encounter: Payer: Self-pay | Admitting: Family Medicine

## 2023-11-19 NOTE — Progress Notes (Signed)
 Hi Margaret [name], triglycerides are up and LDL is up a little bit.  Just continue to work on healthy diet and regular exercise.  Your vitamin D  is better than it was 7 months ago's were making good progress.  But still not quite in the normal range so continue with your vitamin D  supplementation and we will recheck again in 3 months.  Kidney function looks a little better this time.  Liver is normal.  And protein levels are back down in the urine which is great news!  Just encourage you to schedule colon cancer screening when you get the opportunity.  I do have a reminder that you are due.

## 2023-11-20 ENCOUNTER — Telehealth: Payer: Self-pay

## 2023-11-20 DIAGNOSIS — Z1211 Encounter for screening for malignant neoplasm of colon: Secondary | ICD-10-CM

## 2023-11-20 NOTE — Telephone Encounter (Signed)
 Copied from CRM [id number]. Topic: General - Other >> Nov 20, 2023  2:47 PM Dominique A wrote: Reason for CRM: Patient states that she has been having issues with her phone and is going to have to buy a new phone. Patient states until she gets a new phone to please call her daughter Graydon Lazier phone for anything related to her [phone].  Patient has an upcoming appt on 5/22 and is wanting her MRI, Pap, and Colonoscopy done before this appointment.  Patient states for her colonoscopy she normally has the kit mailed to her. Please call pt back to discuss [phone].

## 2023-11-20 NOTE — Telephone Encounter (Signed)
 I do not know what MRI she is talking about either.  You may have to call her directly and see if you can get more information.  But again she also does not need a Pap smear since she is over 65.  Plus we would not be able to do that before the appointment we would have to do that during the appointment if she does need a pelvic exam.

## 2023-11-20 NOTE — Telephone Encounter (Signed)
 MMG ordered by Margaret [name] on 11/15/2023 PAP shows in health maintenance as patient has "aged out " Cologuard order pended.  Not sure what MRI she is speaking of  If you are not sure either let me know and I will contact patient to iquire

## 2023-11-21 NOTE — Telephone Encounter (Signed)
 Attempted call to patient daughter Pinky Bright - left a voice mail message requesting a return call.

## 2023-11-28 NOTE — Telephone Encounter (Signed)
 Attempted call to patient daughter Pinky Bright - left a voice mail message requesting a return call.

## 2023-11-30 NOTE — Telephone Encounter (Signed)
 Again attempted to contact patient daughter Pinky Bright- left another voice mail message requesting a return call.

## 2023-12-02 DIAGNOSIS — Z1212 Encounter for screening for malignant neoplasm of rectum: Secondary | ICD-10-CM | POA: Diagnosis not present

## 2023-12-02 DIAGNOSIS — Z1211 Encounter for screening for malignant neoplasm of colon: Secondary | ICD-10-CM | POA: Diagnosis not present

## 2023-12-03 ENCOUNTER — Telehealth: Payer: Self-pay

## 2023-12-03 NOTE — Telephone Encounter (Signed)
 Copied from CRM [id number]. Topic: General - Other >> Nov 30, 2023  5:01 PM Brynn Caras wrote: Reason for CRM: The patient returned Nurse Kim's missed call this afternoon, advised this is regarding her health maintenance and upcoming appointment to gain further clarification. Advised she should expect another callback to discuss this due to the closing of the office. PT is aware.

## 2023-12-03 NOTE — Telephone Encounter (Signed)
 Will attempt to contact patient daughter again.

## 2023-12-04 ENCOUNTER — Telehealth: Payer: Self-pay

## 2023-12-04 NOTE — Telephone Encounter (Signed)
 Copied from CRM [id number]. Topic: General - Other >> Dec 04, 2023  3:37 PM Retta Caster wrote: Patient returning call from office due to contacted daughter but n/a. Called and transferred to Nurse Burdette Carolin

## 2023-12-04 NOTE — Telephone Encounter (Signed)
 Spoke with patient. Nothing further needed from our office.

## 2023-12-04 NOTE — Telephone Encounter (Signed)
 Message was left for daughter before speaking with patient. Patient informed of this.

## 2023-12-09 ENCOUNTER — Encounter: Payer: Self-pay | Admitting: Family Medicine

## 2023-12-09 LAB — COLOGUARD: COLOGUARD: NEGATIVE

## 2023-12-09 NOTE — Progress Notes (Signed)
 Great news! Your Cologuard test is negative.  Recommend repeat colon cancer screening in 3 years.

## 2023-12-11 ENCOUNTER — Ambulatory Visit: Payer: Self-pay

## 2023-12-14 ENCOUNTER — Encounter: Payer: Self-pay | Admitting: Family Medicine

## 2023-12-20 ENCOUNTER — Encounter: Payer: Self-pay | Admitting: Family Medicine

## 2023-12-20 ENCOUNTER — Ambulatory Visit (INDEPENDENT_AMBULATORY_CARE_PROVIDER_SITE_OTHER): Admitting: Family Medicine

## 2023-12-20 VITALS — BP 131/83 | HR 84 | Ht 67.0 in | Wt 165.3 lb

## 2023-12-20 DIAGNOSIS — N1831 Chronic kidney disease, stage 3a: Secondary | ICD-10-CM

## 2023-12-20 DIAGNOSIS — F339 Major depressive disorder, recurrent, unspecified: Secondary | ICD-10-CM | POA: Diagnosis not present

## 2023-12-20 DIAGNOSIS — M810 Age-related osteoporosis without current pathological fracture: Secondary | ICD-10-CM

## 2023-12-20 DIAGNOSIS — I10 Essential (primary) hypertension: Secondary | ICD-10-CM | POA: Diagnosis not present

## 2023-12-20 DIAGNOSIS — E785 Hyperlipidemia, unspecified: Secondary | ICD-10-CM | POA: Diagnosis not present

## 2023-12-20 MED ORDER — FLUOXETINE HCL 10 MG PO CAPS: 10.0000 mg | ORAL_CAPSULE | Freq: Every day | ORAL | 3 refills | Status: AC

## 2023-12-20 MED ORDER — SIMVASTATIN 40 MG PO TABS: ORAL_TABLET | ORAL | 3 refills | Status: AC

## 2023-12-20 MED ORDER — HYDROCHLOROTHIAZIDE 12.5 MG PO CAPS: ORAL_CAPSULE | ORAL | 3 refills | Status: AC

## 2023-12-20 MED ORDER — ALENDRONATE SODIUM 70 MG PO TABS: ORAL_TABLET | ORAL | 4 refills | Status: AC

## 2023-12-20 NOTE — Assessment & Plan Note (Signed)
 DEXA is scheduled.  Continue Fosamax , calcium, vitamin D .

## 2023-12-20 NOTE — Assessment & Plan Note (Signed)
 To keep an eye on renal function.  Did discuss that she is in CKD 3 and to limit use of NSAIDs like ibuprofen  and Aleve she says she occasionally will take an ibuprofen  if her pain is bad.      Latest Ref Rng & Units 11/15/2023   10:23 AM 04/12/2023    1:51 PM 10/12/2022    1:52 PM  BMP  Glucose 70 - 99 mg/dL 88  64  94   BUN 8 - 27 mg/dL 15  18  14    Creatinine 0.57 - 1.00 mg/dL 4.09  8.11  9.14   BUN/Creat Ratio 12 - 28 15  18   SEE NOTE:   Sodium 134 - 144 mmol/L 140  141  144   Potassium 3.5 - 5.2 mmol/L 4.6  4.0  4.2   Chloride 96 - 106 mmol/L 100  103  107   CO2 20 - 29 mmol/L 23  22  29    Calcium 8.7 - 10.3 mg/dL 9.9  9.1  9.3

## 2023-12-20 NOTE — Assessment & Plan Note (Signed)
 Pressure looks great today.  Continue current regimen.  Follow-up in 6 months.  Labs are up-to-date.  Make sure that she has refills available.

## 2023-12-20 NOTE — Progress Notes (Signed)
 Established Patient Office Visit  Subjective  Patient ID: Margaret [name], female    DOB: 08-31-1950  Age: 73 y.o. MRN: [id number]  Chief Complaint  Patient presents with   Hypertension    HPI   Hypertension- Pt denies chest pain, SOB, dizziness, or heart palpitations.  Taking meds as directed w/o problems.  Denies medication side effects.    Is doing well her daughter and grandchild are living with her.  It is really kept her going and kept her busy.  She still gets a little bit of downtime so it works out.  Her ankle healed well and she is doing great with that.  She did get her blood work done a couple weeks ago.  DEXA and  mammogram are scheduled for next week.    ROS    Objective:     BP 131/83   Pulse 84   Ht 5\' 7"  (1.702 m)   Wt 165 lb 4.8 oz (75 kg)   SpO2 96%   BMI 25.89 kg/m    Physical Exam Vitals and nursing note reviewed.  Constitutional:      Appearance: Normal appearance.  HENT:     Head: Normocephalic and atraumatic.  Eyes:     Conjunctiva/sclera: Conjunctivae normal.  Cardiovascular:     Rate and Rhythm: Normal rate and regular rhythm.  Pulmonary:     Effort: Pulmonary effort is normal.     Breath sounds: Normal breath sounds.  Skin:    General: Skin is warm and dry.  Neurological:     Mental Status: She is alert.  Psychiatric:        Mood and Affect: Mood normal.    No results found for any visits on 12/20/23.    The 10-year ASCVD risk score (Arnett DK, et al., 2019) is: 18.2%    Assessment & Plan:   Problem List Items Addressed This Visit       Cardiovascular and Mediastinum   Essential hypertension - Primary   Pressure looks great today.  Continue current regimen.  Follow-up in 6 months.  Labs are up-to-date.  Make sure that she has refills available.      Relevant Medications   hydrochlorothiazide  (MICROZIDE ) 12.5 MG capsule   simvastatin  (ZOCOR ) 40 MG tablet     Musculoskeletal and Integument   Osteoporosis    DEXA is scheduled.  Continue Fosamax , calcium, vitamin D .      Relevant Medications   alendronate  (FOSAMAX ) 70 MG tablet     Genitourinary   CKD (chronic kidney disease) stage 3, GFR 30-59 ml/min (HCC)   To keep an eye on renal function.  Did discuss that she is in CKD 3 and to limit use of NSAIDs like ibuprofen  and Aleve she says she occasionally will take an ibuprofen  if her pain is bad.      Latest Ref Rng & Units 11/15/2023   10:23 AM 04/12/2023    1:51 PM 10/12/2022    1:52 PM  BMP  Glucose 70 - 99 mg/dL 88  64  94   BUN 8 - 27 mg/dL 15  18  14    Creatinine 0.57 - 1.00 mg/dL 4.09  8.11  9.14   BUN/Creat Ratio 12 - 28 15  18   SEE NOTE:   Sodium 134 - 144 mmol/L 140  141  144   Potassium 3.5 - 5.2 mmol/L 4.6  4.0  4.2   Chloride 96 - 106 mmol/L 100  103  107   CO2  20 - 29 mmol/L 23  22  29    Calcium 8.7 - 10.3 mg/dL 9.9  9.1  9.3            Other   Hyperlipidemia   Relevant Medications   hydrochlorothiazide  (MICROZIDE ) 12.5 MG capsule   simvastatin  (ZOCOR ) 40 MG tablet   Depression, recurrent (HCC)   PHQ-9 and GAD-7 scores look great today continue with fluoxetine  10 mg daily.      Relevant Medications   FLUoxetine  (PROZAC ) 10 MG capsule    Return in about 6 months (around 06/21/2024) for Hypertension.    Duaine German, MD

## 2023-12-20 NOTE — Assessment & Plan Note (Addendum)
 PHQ-9 and GAD-7 scores look great today continue with fluoxetine  10 mg daily.

## 2023-12-26 ENCOUNTER — Ambulatory Visit (INDEPENDENT_AMBULATORY_CARE_PROVIDER_SITE_OTHER)

## 2023-12-26 ENCOUNTER — Ambulatory Visit

## 2023-12-26 DIAGNOSIS — Z1231 Encounter for screening mammogram for malignant neoplasm of breast: Secondary | ICD-10-CM | POA: Diagnosis not present

## 2023-12-26 DIAGNOSIS — M810 Age-related osteoporosis without current pathological fracture: Secondary | ICD-10-CM

## 2023-12-26 DIAGNOSIS — M8589 Other specified disorders of bone density and structure, multiple sites: Secondary | ICD-10-CM | POA: Diagnosis not present

## 2023-12-26 DIAGNOSIS — Z13820 Encounter for screening for osteoporosis: Secondary | ICD-10-CM

## 2023-12-26 DIAGNOSIS — Z780 Asymptomatic menopausal state: Secondary | ICD-10-CM | POA: Diagnosis not present

## 2023-12-27 ENCOUNTER — Ambulatory Visit: Payer: Self-pay | Admitting: Family Medicine

## 2023-12-27 DIAGNOSIS — M810 Age-related osteoporosis without current pathological fracture: Secondary | ICD-10-CM

## 2023-12-27 NOTE — Assessment & Plan Note (Signed)
 May 2025: DEXA T-score -1.6

## 2023-12-27 NOTE — Progress Notes (Signed)
 Hi Margaret [name], bone density shows a T-score of -1.6.  No significant change from your prior bone density which is fantastic.  Continue with calcium, vitamin D , Fosamax  and resistance/weight training to continue to keep your bones strong.  The plan will be to continue the Fosamax  for 5 years and at that point if everything is stable then we will plan to take a holiday from the medication.

## 2023-12-28 ENCOUNTER — Ambulatory Visit: Payer: Self-pay | Admitting: Family Medicine

## 2023-12-28 NOTE — Progress Notes (Signed)
 Please call patient. Normal mammogram.  Repeat in 1 year.

## 2024-04-01 ENCOUNTER — Encounter: Payer: Self-pay | Admitting: Sports Medicine

## 2024-06-12 ENCOUNTER — Encounter: Payer: Self-pay | Admitting: Family Medicine

## 2024-06-12 ENCOUNTER — Ambulatory Visit (INDEPENDENT_AMBULATORY_CARE_PROVIDER_SITE_OTHER): Admitting: Family Medicine

## 2024-06-12 VITALS — BP 130/76 | HR 88 | Ht 67.0 in | Wt 164.1 lb

## 2024-06-12 DIAGNOSIS — N1831 Chronic kidney disease, stage 3a: Secondary | ICD-10-CM

## 2024-06-12 DIAGNOSIS — M810 Age-related osteoporosis without current pathological fracture: Secondary | ICD-10-CM

## 2024-06-12 DIAGNOSIS — J019 Acute sinusitis, unspecified: Secondary | ICD-10-CM | POA: Diagnosis not present

## 2024-06-12 DIAGNOSIS — E559 Vitamin D deficiency, unspecified: Secondary | ICD-10-CM

## 2024-06-12 DIAGNOSIS — I10 Essential (primary) hypertension: Secondary | ICD-10-CM | POA: Diagnosis not present

## 2024-06-12 DIAGNOSIS — H269 Unspecified cataract: Secondary | ICD-10-CM | POA: Diagnosis not present

## 2024-06-12 LAB — POC SOFIA 2 FLU + SARS ANTIGEN FIA
Influenza A, POC: NEGATIVE
Influenza B, POC: NEGATIVE
SARS Coronavirus 2 Ag: NEGATIVE

## 2024-06-12 MED ORDER — AMOXICILLIN 875 MG PO TABS: 875.0000 mg | ORAL_TABLET | Freq: Two times a day (BID) | ORAL | 0 refills | Status: DC

## 2024-06-12 NOTE — Assessment & Plan Note (Signed)
 Well controlled. Continue current regimen. Follow up in  6 mo

## 2024-06-12 NOTE — Assessment & Plan Note (Signed)
Monitor renal function Q 6 mo

## 2024-06-12 NOTE — Progress Notes (Signed)
 Established Patient Office Visit  Patient ID: Margaret [name], female    DOB: Dec 17, 1950  Age: 73 y.o. MRN: [id number] PCP: Alvan Margaret BIRCH, MD  Chief Complaint  Patient presents with   Hypertension    Subjective:     HPI  Discussed the use of AI scribe software for clinical note transcription with the patient, who gave verbal consent to proceed.  History of Present Illness Margaret [name] is a 73 year old female who presents with upper respiratory symptoms for one week.  Upper respiratory symptoms - Cough and watery eyes for greater than one week duration - Initial sore throat, improved with Mucinex - Symptoms improve with continued use of Mucinex, return if discontinued - No fever - No ear pain - No recent chest pain or shortness of breath - No COVID-19 test performed  Antihistamine and expectorant use - Initially used over-the-counter 12-hour pills without significant relief - Currently taking Mucinex as recommended by her daughter - Symptom improvement noted with Mucinex  Osteoporosis - Long-term Fosamax  therapy - Recent bone density scan with T score of -1.6  History of covid-19 infection - Severe COVID-19 infection in November 2020 - Required assistance from her son due to significant fatigue and difficulty ambulating     ROS    Objective:     BP 130/76   Pulse 88   Ht 5' 7 (1.702 m)   Wt 164 lb 1.9 oz (74.4 kg)   SpO2 98%   BMI 25.70 kg/m    Physical Exam Constitutional:      Appearance: Normal appearance.  HENT:     Head: Normocephalic and atraumatic.     Right Ear: Ear canal and external ear normal. There is no impacted cerumen.     Left Ear: Tympanic membrane, ear canal and external ear normal. There is no impacted cerumen.     Ears:     Comments: Right TM blocked by cerumen.     Nose: Nose normal.     Mouth/Throat:     Pharynx: Oropharynx is clear.  Eyes:     Conjunctiva/sclera: Conjunctivae normal.  Cardiovascular:      Rate and Rhythm: Normal rate and regular rhythm.     Heart sounds: Murmur heard.  Pulmonary:     Effort: Pulmonary effort is normal.     Breath sounds: Normal breath sounds.  Musculoskeletal:     Cervical back: Neck supple. No tenderness.  Lymphadenopathy:     Cervical: No cervical adenopathy.  Skin:    General: Skin is warm and dry.  Neurological:     Mental Status: She is alert and oriented to person, place, and time.  Psychiatric:        Mood and Affect: Mood normal.      Results for orders placed or performed in visit on 06/12/24  POC SOFIA 2 FLU + SARS ANTIGEN FIA  Result Value Ref Range   Influenza A, POC Negative Negative   Influenza B, POC Negative Negative   SARS Coronavirus 2 Ag Negative Negative      The 10-year ASCVD risk score (Arnett DK, et al., 2019) is: 19.8%    Assessment & Plan:   Problem List Items Addressed This Visit       Cardiovascular and Mediastinum   Essential hypertension - Primary   Well controlled. Continue current regimen. Follow up in  66mo       Relevant Orders   Vitamin D  (25 hydroxy)   CMP14+EGFR  Musculoskeletal and Integument   Osteoporosis     Genitourinary   CKD (chronic kidney disease) stage 3, GFR 30-59 ml/min (HCC)   Monitor renal function Q 6 mo       Relevant Orders   Vitamin D  (25 hydroxy)   CMP14+EGFR     Other   Vitamin D  deficiency   Relevant Orders   Vitamin D  (25 hydroxy)   Other Visit Diagnoses       Cataract of both eyes, unspecified cataract type       Relevant Orders   Ambulatory referral to Ophthalmology     Acute non-recurrent sinusitis, unspecified location       Relevant Medications   amoxicillin  (AMOXIL ) 875 MG tablet   Other Relevant Orders   POC SOFIA 2 FLU + SARS ANTIGEN FIA (Completed)       Assessment and Plan Assessment & Plan Acute upper respiratory infection now turned to sinusitis.   Symptoms include cough, watery eyes, and nasal congestion. COVID-19 considered but less  likely due to absence of fever. - Swabbed for COVID-19. - Continue Mucinex for symptom relief. - neg for COVID will tx with amox x 7 days.   Osteoporosis  T-score of -1.6. Long-term Fosamax  use beneficial. Discussed drug holiday but advised continuation due to age and fracture history. - Continue Fosamax . - Ensure adequate intake of calcium and vitamin D . - Engage in resistance/weight training exercises.  Cataracts Surgical intervention required. Referral to eye surgeon necessary. - Referred to Dr. Gennie for cataract evaluation and management.  General Health Maintenance Discussed importance of vaccinations, including tetanus and shingles. Explained cost management options and Medicare coverage possibilities. - Consider tetanus vaccine, available at [facility] for $60 with GoodRx. - Consider shingles vaccine, available at [facility] for $230. - Encouraged use of GoodRx for cost management. - Encouraged checking with pharmacy for vaccine coverage under traditional Medicare.    Return in about 6 months (around 12/10/2024) for Hypertension.    Margaret Byars, MD [facility] Primary Care & Sports Medicine at [facility]

## 2024-06-13 ENCOUNTER — Ambulatory Visit: Payer: Self-pay | Admitting: Family Medicine

## 2024-06-13 LAB — CMP14+EGFR
ALT: 10 IU/L (ref 0–32)
AST: 12 IU/L (ref 0–40)
Albumin: 4.5 g/dL (ref 3.8–4.8)
Alkaline Phosphatase: 77 IU/L (ref 49–135)
BUN/Creatinine Ratio: 22 (ref 12–28)
BUN: 21 mg/dL (ref 8–27)
Bilirubin Total: 0.6 mg/dL (ref 0.0–1.2)
CO2: 22 mmol/L (ref 20–29)
Calcium: 9.1 mg/dL (ref 8.7–10.3)
Chloride: 106 mmol/L (ref 96–106)
Creatinine, Ser: 0.94 mg/dL (ref 0.57–1.00)
Globulin, Total: 2.4 g/dL (ref 1.5–4.5)
Glucose: 99 mg/dL (ref 70–99)
Potassium: 4.3 mmol/L (ref 3.5–5.2)
Sodium: 141 mmol/L (ref 134–144)
Total Protein: 6.9 g/dL (ref 6.0–8.5)
eGFR: 64 mL/min/1.73 (ref >59)

## 2024-06-13 LAB — VITAMIN D 25 HYDROXY (VIT D DEFICIENCY, FRACTURES): Vit D, 25-Hydroxy: 21.4 ng/mL — ABNORMAL LOW (ref 30.0–100.0)

## 2024-06-13 NOTE — Progress Notes (Signed)
 Hi Margaret [name], your vitamin D  still low recommend 50 mcg daily.  This can be found over-the-counter.  Metabolic panel is normal.

## 2024-07-10 ENCOUNTER — Ambulatory Visit

## 2024-07-17 ENCOUNTER — Ambulatory Visit

## 2024-07-17 VITALS — BP 144/89 | HR 87 | Ht 67.0 in | Wt 166.0 lb

## 2024-07-17 DIAGNOSIS — Z0000 Encounter for general adult medical examination without abnormal findings: Secondary | ICD-10-CM

## 2024-07-17 NOTE — Progress Notes (Signed)
 Chief Complaint  Patient presents with   Medicare Wellness     Subjective:   Margaret [name] is a 73 y.o. female who presents for a Medicare Annual Wellness Visit.  Visit info / Clinical Intake: Medicare Wellness Visit Type:: Subsequent Annual Wellness Visit Persons participating in visit and providing information:: patient Medicare Wellness Visit Mode:: In-person (required for WTM) Interpreter Needed?: No Pre-visit prep was completed: yes AWV questionnaire completed by patient prior to visit?: no Living arrangements:: with family/others Patient's Overall Health Status Rating: good Typical amount of pain: none Does pain affect daily life?: no Are you currently prescribed opioids?: no  Dietary Habits and Nutritional Risks How many meals a day?: 2 Eats fruit and vegetables daily?: yes Most meals are obtained by: preparing own meals In the last 2 weeks, have you had any of the following?: none Diabetic:: no  Functional Status Activities of Daily Living (to include ambulation/medication): Independent Ambulation: Independent Medication Administration: Independent Home Management (perform basic housework or laundry): Independent Manage your own finances?: yes Primary transportation is: driving Concerns about vision?: no *vision screening is required for WTM* Concerns about hearing?: no  Fall Screening Falls in the past year?: 0 Number of falls in past year: 0 Was there an injury with Fall?: 0 Fall Risk Category Calculator: 0 Patient Fall Risk Level: Low Fall Risk  Fall Risk Patient at Risk for Falls Due to: No Fall Risks Fall risk Follow up: Falls evaluation completed  Home and Transportation Safety: All rugs have non-skid backing?: yes All stairs or steps have railings?: yes Grab bars in the bathtub or shower?: (!) no Have non-skid surface in bathtub or shower?: yes Good home lighting?: yes Regular seat belt use?: yes Hospital stays in the last year::  no  Cognitive Assessment Difficulty concentrating, remembering, or making decisions? : no Will 6CIT or Mini Cog be Completed: yes What year is it?: 0 points What month is it?: 0 points Give patient an address phrase to remember (5 components): [address] [place], [place] [address] About what time is it?: 0 points Count backwards from 20 to 1: 0 points Say the months of the year in reverse: 0 points Repeat the address phrase from earlier: 0 points 6 CIT Score: 0 points  Advance Directives (For Healthcare) Does Patient Have a Medical Advance Directive?: Yes Does patient want to make changes to medical advance directive?: No - Patient declined Type of Advance Directive: Healthcare Power of [address]; Living will  Reviewed/Updated  Reviewed/Updated: Reviewed All (Medical, Surgical, Family, Medications, Allergies, Care Teams, Patient Goals)    Allergies (verified) Diphenhydramine hcl, Codeine , and Fish oil   Current Medications (verified) Outpatient Encounter Medications as of 07/17/2024  Medication Sig   alendronate  (FOSAMAX ) 70 MG tablet TAKE 1 TABLET WEEKLY TAKE ON EMPTY STOMACH WITH 8 OZ OF WATER(DO NOT LIE DOWN FOR 30 MINUTES)   Ascorbic Acid (VITAMIN C) 500 MG CAPS Take 2 capsules by mouth daily.   Calcium Carbonate-Vit D-Min (CALCIUM 1200 PO) Take 2 capsules by mouth daily.   Cetirizine HCl 10 MG CAPS Take 1 capsule by mouth daily.   FLUoxetine  (PROZAC ) 10 MG capsule Take 1 capsule (10 mg total) by mouth daily.   hydrochlorothiazide  (MICROZIDE ) 12.5 MG capsule TAKE 1 CAPSULE(12.5 MG) BY MOUTH DAILY   Multiple Vitamins-Minerals (CENTRUM SILVER ADULT 50+ PO) Take 1 capsule by mouth daily.   ranitidine (ZANTAC) 150 MG tablet Take 150 mg by mouth daily.   simvastatin  (ZOCOR ) 40 MG tablet TAKE 1 TABLET(40  MG) BY MOUTH AT BEDTIME   [DISCONTINUED] amoxicillin  (AMOXIL ) 875 MG tablet Take 1 tablet (875 mg total) by mouth 2 (two) times daily.   No facility-administered encounter  medications on file as of 07/17/2024.    History: Past Medical History:  Diagnosis Date   Cancer (HCC)    skin cancer-leg   GERD (gastroesophageal reflux disease)    Hyperlipidemia    Hypertension    Impaired fasting glucose    Past Surgical History:  Procedure Laterality Date   bladder tack     CYSTOCELE REPAIR     PILONIDAL CYST EXCISION     VAGINAL HYSTERECTOMY     Family History  Problem Relation Age of Onset   Stomach cancer Father 71       deceased   Diabetes Father    Cancer Father        stomach   Bell's palsy Mother    Parkinsonism Mother        died age 42   Dementia Mother    Asthma Mother    Asthma Other    Depression Other    Bipolar disorder Other    Diabetes Sister    Diabetes Brother    Colon polyps Brother    Colon cancer Neg Hx    Esophageal cancer Neg Hx    Social History   Occupational History   Occupation: Retired  Tobacco Use   Smoking status: Former    Types: Cigarettes   Smokeless tobacco: Never  Vaping Use   Vaping status: Never Used  Substance and Sexual Activity   Alcohol use: No   Drug use: No   Sexual activity: Not on file   Tobacco Counseling Counseling given: Not Answered  SDOH Screenings   Food Insecurity: No Food Insecurity (07/17/2024)  Housing: Low Risk (07/17/2024)  Transportation Needs: No Transportation Needs (07/17/2024)  Utilities: Not At Risk (07/17/2024)  Depression (PHQ2-9): Low Risk (07/17/2024)  Financial Resource Strain: Low Risk (04/17/2022)  Physical Activity: Insufficiently Active (07/17/2024)  Social Connections: Socially Isolated (07/17/2024)  Stress: No Stress Concern Present (07/17/2024)  Tobacco Use: Medium Risk (07/17/2024)  Health Literacy: Adequate Health Literacy (07/17/2024)   See flowsheets for full screening details  Depression Screen PHQ 2 & 9 Depression Scale- Over the past 2 weeks, how often have you been bothered by any of the following problems? Little interest or pleasure in  doing things: 0 Feeling down, depressed, or hopeless (PHQ Adolescent also includes...irritable): 0 PHQ-2 Total Score: 0 Trouble falling or staying asleep, or sleeping too much: 0 Feeling tired or having little energy: 0 Poor appetite or overeating (PHQ Adolescent also includes...weight loss): 0 Feeling bad about yourself - or that you are a failure or have let yourself or your family down: 0 Trouble concentrating on things, such as reading the newspaper or watching television (PHQ Adolescent also includes...like school work): 0 Moving or speaking so slowly that other people could have noticed. Or the opposite - being so fidgety or restless that you have been moving around a lot more than usual: 0 Thoughts that you would be better off dead, or of hurting yourself in some way: 0 PHQ-9 Total Score: 0     Goals Addressed             This Visit's Progress    Patient Stated       Afsa would like to lower her blood pressure.              Objective:  Today's Vitals   07/17/24 1337  BP: (!) 142/92  Pulse: 87  SpO2: 97%  Weight: 166 lb (75.3 kg)  Height: 5' 7 (1.702 m)   Body mass index is 26 kg/m.  Hearing/Vision screen No results found. Immunizations and Health Maintenance Health Maintenance  Topic Date Due   DTaP/Tdap/Td (3 - Tdap) 10/04/2020   COVID-19 Vaccine (1 - 2025-26 season) Never done   Zoster Vaccines- Shingrix  (1 of 2) 09/12/2024 (Originally 04/25/2000)   Influenza Vaccine  10/28/2024 (Originally 02/29/2024)   Medicare Annual Wellness (AWV)  07/17/2025   Mammogram  12/25/2025   Fecal DNA (Cologuard)  12/02/2026   Pneumococcal Vaccine: 50+ Years  Completed   Bone Density Scan  Completed   Hepatitis C Screening  Completed   Meningococcal B Vaccine  Aged Out   Colonoscopy  Discontinued        Assessment/Plan:  This is a routine wellness examination for [place].  Patient Care Team: Alvan Dorothyann BIRCH, MD as PCP - General  I have personally reviewed  and noted the following in the patients chart:   Medical and social history Use of alcohol, tobacco or illicit drugs  Current medications and supplements including opioid prescriptions. Functional ability and status Nutritional status Physical activity Advanced directives List of other physicians Hospitalizations, surgeries, and ER visits in previous 12 months Vitals Screenings to include cognitive, depression, and falls Referrals and appointments  No orders of the defined types were placed in this encounter.  In addition, I have reviewed and discussed with patient certain preventive protocols, quality metrics, and best practice recommendations. A written personalized care plan for preventive services as well as general preventive health recommendations were provided to patient.   Bonny Jon Mayor, CMA   07/17/2024   Return in 1 year (on 07/17/2025).  After Visit Summary: (In Person-Printed) AVS printed and given to the patient  Nurse Notes:   JANEI SCHEFF is a 73 y.o. female patient of Metheney, Dorothyann BIRCH, MD who had a Medicare Annual Wellness Visit today. Jayln is Retired and lives with their family. She has 2 children. She reports that she is socially active and does interact with friends/family regularly. She is minimally physically active and enjoys reading, playing the piano and spending time with her grandson.

## 2024-07-17 NOTE — Patient Instructions (Signed)
 Margaret [name],  Thank you for taking the time for your Medicare Wellness Visit. I appreciate your continued commitment to your health goals. Please review the care plan we discussed, and feel free to reach out if I can assist you further.  Please note that Annual Wellness Visits do not include a physical exam. Some assessments may be limited, especially if the visit was conducted virtually. If needed, we may recommend an in-person follow-up with your provider.  Ongoing Care Seeing your primary care provider every 3 to 6 months helps us  monitor your health and provide consistent, personalized care.   Referrals If a referral was made during today's visit and you haven't received any updates within two weeks, please contact the referred provider directly to check on the status.  Recommended Screenings:  Health Maintenance  Topic Date Due   DTaP/Tdap/Td vaccine (3 - Tdap) 10/04/2020   Medicare Annual Wellness Visit  04/18/2023   COVID-19 Vaccine (1 - 2025-26 season) Never done   Zoster (Shingles) Vaccine (1 of 2) 09/12/2024*   Flu Shot  10/28/2024*   Breast Cancer Screening  12/25/2025   Cologuard (Stool DNA test)  12/02/2026   Pneumococcal Vaccine for age over 61  Completed   Osteoporosis screening with Bone Density Scan  Completed   Hepatitis C Screening  Completed   Meningitis B Vaccine  Aged Out   Colon Cancer Screening  Discontinued  *Topic was postponed. The date shown is not the original due date.       07/17/2024    1:48 PM  Advanced Directives  Does Patient Have a Medical Advance Directive? Yes  Type of Estate Agent of [place];Living will  Does patient want to make changes to medical advance directive? No - Patient declined    Vision: Annual vision screenings are recommended for early detection of glaucoma, cataracts, and diabetic retinopathy. These exams can also reveal signs of chronic conditions such as diabetes and high blood pressure.  Dental:  Annual dental screenings help detect early signs of oral cancer, gum disease, and other conditions linked to overall health, including heart disease and diabetes.  Please see the attached documents for additional preventive care recommendations.

## 2024-12-11 ENCOUNTER — Ambulatory Visit: Admitting: Family Medicine
